# Patient Record
Sex: Female | Born: 1960 | Race: Black or African American | Hispanic: No | Marital: Single | Smoking: Never smoker
Health system: Southern US, Community
[De-identification: ages and names within clinical notes are randomized; demographics above are authoritative.]

## PROBLEM LIST (undated history)

## (undated) DIAGNOSIS — E119 Type 2 diabetes mellitus without complications: Secondary | ICD-10-CM

## (undated) DIAGNOSIS — I10 Essential (primary) hypertension: Secondary | ICD-10-CM

## (undated) HISTORY — PX: ABDOMINAL HYSTERECTOMY: SHX81

---

## 2019-10-14 ENCOUNTER — Other Ambulatory Visit: Payer: Self-pay

## 2019-10-14 ENCOUNTER — Emergency Department (HOSPITAL_COMMUNITY)
Admission: EM | Admit: 2019-10-14 | Discharge: 2019-10-14 | Discharge status: Against Medical Advice | Payer: Medicare Other | Attending: Emergency Medicine | Admitting: Emergency Medicine

## 2019-10-14 ENCOUNTER — Encounter (HOSPITAL_COMMUNITY): Payer: Self-pay | Admitting: Pediatrics

## 2019-10-14 DIAGNOSIS — Z5321 Procedure and treatment not carried out due to patient leaving prior to being seen by health care provider: Secondary | ICD-10-CM | POA: Insufficient documentation

## 2019-10-14 DIAGNOSIS — H538 Other visual disturbances: Secondary | ICD-10-CM | POA: Insufficient documentation

## 2019-10-14 DIAGNOSIS — R0789 Other chest pain: Secondary | ICD-10-CM | POA: Insufficient documentation

## 2019-10-14 HISTORY — DX: Type 2 diabetes mellitus without complications: E11.9

## 2019-10-14 HISTORY — DX: Essential (primary) hypertension: I10

## 2019-10-14 NOTE — ED Triage Notes (Signed)
C/o chest pain more consistent the last 2 days; c/o blurred visions as well. Stated hx of high blood pressure and prediabetic

## 2019-10-14 NOTE — ED Notes (Signed)
Pt approached this tech stating she was leaving. Pt was encouraged to stay, pt left.

## 2019-11-20 ENCOUNTER — Encounter (HOSPITAL_COMMUNITY): Payer: Self-pay | Admitting: Emergency Medicine

## 2019-11-20 ENCOUNTER — Other Ambulatory Visit: Payer: Self-pay

## 2019-11-20 ENCOUNTER — Emergency Department (HOSPITAL_COMMUNITY)
Admission: EM | Admit: 2019-11-20 | Discharge: 2019-11-20 | Disposition: A | Discharge status: Home / Self Care | Payer: Medicare Other | Attending: Emergency Medicine | Admitting: Emergency Medicine

## 2019-11-20 DIAGNOSIS — M5431 Sciatica, right side: Secondary | ICD-10-CM

## 2019-11-20 MED ORDER — METHOCARBAMOL 500 MG PO TABS: 500.0000 mg | ORAL_TABLET | Freq: Two times a day (BID) | ORAL | 0 refills | Status: DC

## 2019-11-20 MED ORDER — DICLOFENAC SODIUM 1 % EX GEL: 2.0000 g | Freq: Four times a day (QID) | CUTANEOUS | 0 refills | Status: AC

## 2019-11-20 MED ORDER — MELOXICAM 7.5 MG PO TABS: 7.5000 mg | ORAL_TABLET | Freq: Every day | ORAL | 0 refills | Status: DC

## 2019-11-20 NOTE — ED Provider Notes (Signed)
[facility] EMERGENCY DEPARTMENT Provider Note   CSN: [id number] Arrival date & time: 11/20/19  1904     History Chief Complaint  Patient presents with  . Sciatica    Connie Mcintosh is a 59 y.o. female.  HPI 59 year old female with a history sciatica, hypertension, DM type II presents to the ER for right-sided sciatica symptoms.  Patient says she has a history, thinks that she has had a flareup secondary to walking around at work.  She notes pain that starts in her buttocks and radiates down her leg.  She also endorses chronic neck pain and occasional numbness and tingling down both her arms.  She has tried to take Motrin for her symptoms with little relief.  She has still been able to ambulate, though does note some pain when she walks.  She denies any groin numbness, foot drop, incontinence of bowel and bladder, night sweats, unintended weight loss, fever, chills.  no history of IV drug use, cancer.  She does not follow with anyone for her back pain.  He states that she knows that she has a history of a herniated disc in her low back.    Past Medical History:  Diagnosis Date  . Diabetes mellitus without complication (HCC)    Prediabetic  . Hypertension     There are no problems to display for this patient.   Past Surgical History:  Procedure Laterality Date  . ABDOMINAL HYSTERECTOMY       OB History   No obstetric history on file.     No family history on file.  Social History   Tobacco Use  . Smoking status: Never Smoker  . Smokeless tobacco: Never Used  Substance Use Topics  . Alcohol use: Never  . Drug use: Never    Home Medications Prior to Admission medications   Medication Sig Start Date End Date Taking? Authorizing Provider  diclofenac Sodium (VOLTAREN) 1 % GEL Apply 2 g topically 4 (four) times daily. 11/20/19   Mare Ferrari, PA-C  meloxicam (MOBIC) 7.5 MG tablet Take 1 tablet (7.5 mg total) by mouth daily. 11/20/19   Mare Ferrari,  PA-C  methocarbamol (ROBAXIN) 500 MG tablet Take 1 tablet (500 mg total) by mouth 2 (two) times daily. 11/20/19   Mare Ferrari, PA-C    Allergies    Morphine and related  Review of Systems   Review of Systems  Constitutional: Negative for chills and fever.  Musculoskeletal: Positive for back pain and gait problem.  Skin: Negative for rash and wound.  Neurological: Positive for numbness. Negative for weakness.    Physical Exam Updated Vital Signs BP (!) 152/103 (BP Location: Right Arm)   Pulse 78   Temp 98.5 F (36.9 C) (Oral)   Resp 20   Ht 5\' 2"  (1.575 m)   Wt 108 kg   SpO2 98%   BMI 43.55 kg/m   Physical Exam Vitals and nursing note reviewed.  Constitutional:      General: She is not in acute distress.    Appearance: She is well-developed.  HENT:     Head: Normocephalic and atraumatic.     Mouth/Throat:     Mouth: Mucous membranes are moist.  Eyes:     Conjunctiva/sclera: Conjunctivae normal.     Pupils: Pupils are equal, round, and reactive to light.  Cardiovascular:     Rate and Rhythm: Normal rate and regular rhythm.     Pulses: Normal pulses.     Heart  sounds: Normal heart sounds. No murmur.     Comments: 2+ DP pulses bilaterally Pulmonary:     Effort: Pulmonary effort is normal. No respiratory distress.     Breath sounds: Normal breath sounds.  Abdominal:     Palpations: Abdomen is soft.     Tenderness: There is no abdominal tenderness.  Musculoskeletal:        General: Tenderness present. No swelling, deformity or signs of injury.     Cervical back: Neck supple.     Right lower leg: No edema.     Left lower leg: No edema.     Comments: 5/5 strength in lower extremities bilaterally.  Sensations intact.  Full range of motion of hip, knee, ankle.  Patient was able to bend down and touch her toes and ambulate in the ER in front of me.  No noticeable neuro deficits in upper extremities, 5/5 strength in upper extremities and full range of motion.   Tenderness over right SI joint and ASIS.  Mild midline tenderness to L-spine.  No noticeable step-offs, crepitus, erythema, rashes.   Skin:    General: Skin is warm and dry.     Findings: No erythema or rash.  Neurological:     General: No focal deficit present.     Mental Status: She is alert and oriented to person, place, and time.     Sensory: No sensory deficit.     Motor: No weakness.     Gait: Gait normal.  Psychiatric:        Mood and Affect: Mood normal.        Behavior: Behavior normal.     ED Results / Procedures / Treatments   Labs (all labs ordered are listed, but only abnormal results are displayed) Labs Reviewed - No data to display  EKG None  Radiology No results found.  Procedures Procedures (including critical care time)  Medications Ordered in ED Medications - No data to display  ED Course  I have reviewed the triage vital signs and the nursing notes.  Pertinent labs & imaging results that were available during my care of the patient were reviewed by me and considered in my medical decision making (see chart for details).    MDM Rules/Calculators/A&P                     Patient with back pain. Mild tenderness to right SI joint and ASIS.  She does have some L-spine midline tenderness, but she states that this is chronic.  She has been told that she has a bulging disc.  Normal neurologic exam without neuro deficits.  Full strength in upper and lower extremities.  Patient denies any red flag signs.  Patient can walk but states is painful.  No loss of bowel or bladder control. No fever, night sweats, weight loss, h/o cancer, IVDU.  I discussed option for x-raying back given midline tenderness, patient states that she would rather follow-up with neurosurgery and have a proper MRI done in outpatient setting.  I have low concern for cauda equina at this time, so I think that this is reasonable.  Clinical picture consistent with sciatica.  Patient has been educated  on return precautions, which include groin numbness, incontinence of bowel and bladder, foot drop, worsening pain.  Discussed treatment options, patient has stated that she has tried naproxen and ibuprofen with little relief.  She has also tried lidocaine patches with little relief.  States that she does not like injections  and denies shot of Toradol today.  I will prescribe her meloxicam, Robaxin and Voltaren gel.  We will defer steroids given that the patient has a history of diabetes.  She would likely benefit from gabapentin, but I will defer this to outpatient management given high side effect profile.  Referral to neurosurgery provided, stressed follow-up.  Patient voices understanding and is agreeable to this.  At this stage in the ED course, the patient has been adequately screened and is stable for discharge.  Final Clinical Impression(s) / ED Diagnoses Final diagnoses:  Right sided sciatica    Rx / DC Orders ED Discharge Orders         Ordered    methocarbamol (ROBAXIN) 500 MG tablet  2 times daily     11/20/19 2037    diclofenac Sodium (VOLTAREN) 1 % GEL  4 times daily     11/20/19 2037    meloxicam (MOBIC) 7.5 MG tablet  Daily     11/20/19 2037           Lyndel Safe 11/20/19 2119    Elnora Morrison, MD 11/21/19 [phone]

## 2019-11-20 NOTE — ED Triage Notes (Signed)
Patient reports sciatica pain onset this week , low back pain radiating to right leg , denies injury/ambulatory .

## 2019-11-20 NOTE — Discharge Instructions (Addendum)
Please take meloxicam for pain, use the Voltaren gel for localized area of pain.  Please take muscle relaxer at night as it does have sedating effects.  Do not drink and drive on this medication.  Please return to the ER if you have worsening pain, groin numbness, inability to hold your urine or stool, development of foot drop, sudden increase in numbness.  Please call the phone number for [facility] to establish an appointment.  I have provided some exercises for your low back to help your sciatica symptoms.

## 2020-01-09 ENCOUNTER — Encounter (HOSPITAL_COMMUNITY): Payer: Self-pay

## 2020-01-09 ENCOUNTER — Emergency Department (HOSPITAL_COMMUNITY)
Admission: EM | Admit: 2020-01-09 | Discharge: 2020-01-09 | Disposition: A | Discharge status: Home / Self Care | Payer: Medicare Other | Attending: Emergency Medicine | Admitting: Emergency Medicine

## 2020-01-09 ENCOUNTER — Emergency Department (HOSPITAL_COMMUNITY): Payer: Medicare Other

## 2020-01-09 ENCOUNTER — Other Ambulatory Visit: Payer: Self-pay

## 2020-01-09 DIAGNOSIS — Z5321 Procedure and treatment not carried out due to patient leaving prior to being seen by health care provider: Secondary | ICD-10-CM | POA: Diagnosis not present

## 2020-01-09 DIAGNOSIS — R0789 Other chest pain: Secondary | ICD-10-CM | POA: Diagnosis present

## 2020-01-09 LAB — CBC
HCT: 39.3 % (ref 36.0–46.0)
Hemoglobin: 12.1 g/dL (ref 12.0–15.0)
MCH: 21.5 pg — ABNORMAL LOW (ref 26.0–34.0)
MCHC: 30.8 g/dL (ref 30.0–36.0)
MCV: 69.7 fL — ABNORMAL LOW (ref 80.0–100.0)
Platelets: 240 10*3/uL (ref 150–400)
RBC: 5.64 MIL/uL — ABNORMAL HIGH (ref 3.87–5.11)
RDW: 16 % — ABNORMAL HIGH (ref 11.5–15.5)
WBC: 8.6 10*3/uL (ref 4.0–10.5)
nRBC: 0 % (ref 0.0–0.2)

## 2020-01-09 LAB — BASIC METABOLIC PANEL
Anion gap: 10 (ref 5–15)
BUN: 16 mg/dL (ref 6–20)
CO2: 24 mmol/L (ref 22–32)
Calcium: 9.3 mg/dL (ref 8.9–10.3)
Chloride: 106 mmol/L (ref 98–111)
Creatinine, Ser: 1.11 mg/dL — ABNORMAL HIGH (ref 0.44–1.00)
GFR calc Af Amer: >60 mL/min (ref >60)
GFR calc non Af Amer: 55 mL/min — ABNORMAL LOW (ref >60)
Glucose, Bld: 100 mg/dL — ABNORMAL HIGH (ref 70–99)
Potassium: 3.5 mmol/L (ref 3.5–5.1)
Sodium: 140 mmol/L (ref 135–145)

## 2020-01-09 LAB — TROPONIN I (HIGH SENSITIVITY): Troponin I (High Sensitivity): 3 ng/L (ref <18)

## 2020-01-09 MED ORDER — SODIUM CHLORIDE 0.9% FLUSH: 3.0000 mL | Freq: Once | INTRAVENOUS | Status: DC

## 2020-01-09 NOTE — ED Notes (Signed)
Pt Left did not want to continue waiting.

## 2020-01-09 NOTE — ED Triage Notes (Signed)
Pt reports Left chest pain radiating to her back associated with "feeling out of it" and diaphoretic x1 week.

## 2020-07-18 ENCOUNTER — Ambulatory Visit (HOSPITAL_COMMUNITY)
Admission: EM | Admit: 2020-07-18 | Discharge: 2020-07-18 | Disposition: A | Discharge status: Home / Self Care | Payer: Medicare Other | Attending: Family Medicine | Admitting: Family Medicine

## 2020-07-18 ENCOUNTER — Emergency Department (HOSPITAL_COMMUNITY): Payer: Medicare Other

## 2020-07-18 ENCOUNTER — Emergency Department (HOSPITAL_COMMUNITY)
Admission: EM | Admit: 2020-07-18 | Discharge: 2020-07-18 | Disposition: A | Discharge status: Home / Self Care | Payer: Medicare Other | Attending: Emergency Medicine | Admitting: Emergency Medicine

## 2020-07-18 ENCOUNTER — Encounter (HOSPITAL_COMMUNITY): Payer: Self-pay

## 2020-07-18 DIAGNOSIS — R197 Diarrhea, unspecified: Secondary | ICD-10-CM | POA: Diagnosis not present

## 2020-07-18 DIAGNOSIS — Z20822 Contact with and (suspected) exposure to covid-19: Secondary | ICD-10-CM | POA: Insufficient documentation

## 2020-07-18 DIAGNOSIS — E119 Type 2 diabetes mellitus without complications: Secondary | ICD-10-CM | POA: Diagnosis not present

## 2020-07-18 DIAGNOSIS — B349 Viral infection, unspecified: Secondary | ICD-10-CM | POA: Diagnosis not present

## 2020-07-18 DIAGNOSIS — R1084 Generalized abdominal pain: Secondary | ICD-10-CM | POA: Insufficient documentation

## 2020-07-18 DIAGNOSIS — R55 Syncope and collapse: Secondary | ICD-10-CM

## 2020-07-18 DIAGNOSIS — R112 Nausea with vomiting, unspecified: Secondary | ICD-10-CM | POA: Diagnosis not present

## 2020-07-18 DIAGNOSIS — R42 Dizziness and giddiness: Secondary | ICD-10-CM | POA: Diagnosis present

## 2020-07-18 DIAGNOSIS — R1110 Vomiting, unspecified: Secondary | ICD-10-CM

## 2020-07-18 DIAGNOSIS — I10 Essential (primary) hypertension: Secondary | ICD-10-CM | POA: Insufficient documentation

## 2020-07-18 LAB — COMPREHENSIVE METABOLIC PANEL
ALT: 11 U/L (ref 0–44)
AST: 15 U/L (ref 15–41)
Albumin: 3.6 g/dL (ref 3.5–5.0)
Alkaline Phosphatase: 79 U/L (ref 38–126)
Anion gap: 11 (ref 5–15)
BUN: 17 mg/dL (ref 6–20)
CO2: 24 mmol/L (ref 22–32)
Calcium: 9.5 mg/dL (ref 8.9–10.3)
Chloride: 108 mmol/L (ref 98–111)
Creatinine, Ser: 1.2 mg/dL — ABNORMAL HIGH (ref 0.44–1.00)
GFR, Estimated: 52 mL/min — ABNORMAL LOW (ref >60)
Glucose, Bld: 90 mg/dL (ref 70–99)
Potassium: 3.8 mmol/L (ref 3.5–5.1)
Sodium: 143 mmol/L (ref 135–145)
Total Bilirubin: 0.7 mg/dL (ref 0.3–1.2)
Total Protein: 7.1 g/dL (ref 6.5–8.1)

## 2020-07-18 LAB — CBC
HCT: 39.7 % (ref 36.0–46.0)
Hemoglobin: 12 g/dL (ref 12.0–15.0)
MCH: 21.2 pg — ABNORMAL LOW (ref 26.0–34.0)
MCHC: 30.2 g/dL (ref 30.0–36.0)
MCV: 70 fL — ABNORMAL LOW (ref 80.0–100.0)
Platelets: 234 10*3/uL (ref 150–400)
RBC: 5.67 MIL/uL — ABNORMAL HIGH (ref 3.87–5.11)
RDW: 16.1 % — ABNORMAL HIGH (ref 11.5–15.5)
WBC: 7.2 10*3/uL (ref 4.0–10.5)
nRBC: 0 % (ref 0.0–0.2)

## 2020-07-18 LAB — URINALYSIS, ROUTINE W REFLEX MICROSCOPIC
Bilirubin Urine: NEGATIVE
Glucose, UA: NEGATIVE mg/dL
Hgb urine dipstick: NEGATIVE
Ketones, ur: NEGATIVE mg/dL
Leukocytes,Ua: NEGATIVE
Nitrite: NEGATIVE
Protein, ur: NEGATIVE mg/dL
Specific Gravity, Urine: 1.032 — ABNORMAL HIGH (ref 1.005–1.030)
pH: 5 (ref 5.0–8.0)

## 2020-07-18 LAB — CBG MONITORING, ED: Glucose-Capillary: 77 mg/dL (ref 70–99)

## 2020-07-18 LAB — LIPASE, BLOOD: Lipase: 37 U/L (ref 11–51)

## 2020-07-18 LAB — POC SARS CORONAVIRUS 2 AG -  ED: SARS Coronavirus 2 Ag: NEGATIVE

## 2020-07-18 MED ORDER — SODIUM CHLORIDE 0.9 % IV BOLUS
1000.0000 mL | Freq: Once | INTRAVENOUS | Status: AC
  Administered 2020-07-18: 1000 mL via INTRAVENOUS

## 2020-07-18 MED ORDER — FENTANYL CITRATE (PF) 100 MCG/2ML IJ SOLN
50.0000 ug | Freq: Once | INTRAMUSCULAR | Status: AC
Start: 2020-07-18 — End: 2020-07-18
  Administered 2020-07-18: 50 ug via INTRAVENOUS
  Filled 2020-07-18: qty 2

## 2020-07-18 MED ORDER — IOHEXOL 300 MG/ML  SOLN
100.0000 mL | Freq: Once | INTRAMUSCULAR | Status: AC | PRN
  Administered 2020-07-18: 100 mL via INTRAVENOUS

## 2020-07-18 MED ORDER — ONDANSETRON HCL 4 MG/2ML IJ SOLN
4.0000 mg | Freq: Once | INTRAMUSCULAR | Status: AC
  Administered 2020-07-18: 4 mg via INTRAVENOUS
  Filled 2020-07-18: qty 2

## 2020-07-18 NOTE — ED Notes (Signed)
Patient transported to CT 

## 2020-07-18 NOTE — ED Notes (Signed)
Patient verbalized understanding of discharge instructions. Opportunity for questions and answers.  

## 2020-07-18 NOTE — ED Triage Notes (Signed)
Pt states nausea/diarrhea onset yesterday. Began vomiting this morning and states she had episode of syncope at home while in the bathroom and unconscious "for several minutes". States she was alone at the time. Drove herself here.  Unable to take BP meds this morning 2/2 n/v. Does not check glucose at home. Notified N. Burky of pt status and c/c; orders for cbg and EKG received. Pt denies CP, diaphoresis or new back pain.  CBG 77

## 2020-07-18 NOTE — ED Provider Notes (Signed)
[place] EMERGENCY DEPARTMENT Provider Note   CSN: [id number] Arrival date & time: 07/18/20  1332     History Chief Complaint  Patient presents with  . Loss of Consciousness  . Diarrhea  . Nausea    Connie Mcintosh is a 60 y.o. female.  Presents to ER with 2 complaints.  She states that she has not been feeling well for the past day or so.  Has had nausea, occasional episodes of diarrhea as well as 1 episode of vomiting this morning.  Nonbloody nonbilious.  Also having some generalized abdominal discomfort.  Pain is currently mild, nausea currently moderate.  No blood in stool or blood in vomit.  No vaginal bleeding, vaginal discharge, not sexually active.   Patient states that after she was going to the bathroom she felt lightheaded and thinks she passed out.  Denies head trauma.    HPI     Past Medical History:  Diagnosis Date  . Diabetes mellitus without complication ([place])    Prediabetic  . Hypertension     There are no problems to display for this patient.   Past Surgical History:  Procedure Laterality Date  . ABDOMINAL HYSTERECTOMY       OB History   No obstetric history on file.     No family history on file.  Social History   Tobacco Use  . Smoking status: Never Smoker  . Smokeless tobacco: Never Used  Substance Use Topics  . Alcohol use: Never  . Drug use: Never    Home Medications Prior to Admission medications   Medication Sig Start Date End Date Taking? Authorizing Provider  diclofenac Sodium (VOLTAREN) 1 % GEL Apply 2 g topically 4 (four) times daily. 11/20/19   Garald Balding, PA-C  meloxicam (MOBIC) 7.5 MG tablet Take 1 tablet (7.5 mg total) by mouth daily. 11/20/19   Garald Balding, PA-C  methocarbamol (ROBAXIN) 500 MG tablet Take 1 tablet (500 mg total) by mouth 2 (two) times daily. 11/20/19   Garald Balding, PA-C    Allergies    Morphine and related  Review of Systems   Review of Systems  Constitutional: Negative  for chills and fever.  HENT: Negative for ear pain and sore throat.   Eyes: Negative for pain and visual disturbance.  Respiratory: Negative for cough and shortness of breath.   Cardiovascular: Negative for chest pain and palpitations.  Gastrointestinal: Positive for abdominal pain, diarrhea, nausea and vomiting.  Genitourinary: Negative for dysuria and hematuria.  Musculoskeletal: Negative for arthralgias and back pain.  Skin: Negative for color change and rash.  Neurological: Positive for syncope. Negative for seizures.  All other systems reviewed and are negative.   Physical Exam Updated Vital Signs BP (!) 148/87   Pulse (!) 58   Temp 97.8 F (36.6 C) (Oral)   Resp 16   SpO2 100%   Physical Exam Vitals and nursing note reviewed.  Constitutional:      General: She is not in acute distress.    Appearance: She is well-developed and well-nourished.  HENT:     Head: Normocephalic and atraumatic.  Eyes:     Conjunctiva/sclera: Conjunctivae normal.  Cardiovascular:     Rate and Rhythm: Normal rate and regular rhythm.     Heart sounds: No murmur heard.   Pulmonary:     Effort: Pulmonary effort is normal. No respiratory distress.     Breath sounds: Normal breath sounds.  Abdominal:     Palpations: Abdomen  is soft.     Tenderness: There is no abdominal tenderness.     Comments: TTP in RLQ and LLQ  Musculoskeletal:        General: No edema.     Cervical back: Neck supple.  Skin:    General: Skin is warm and dry.  Neurological:     General: No focal deficit present.     Mental Status: She is alert.  Psychiatric:        Mood and Affect: Mood and affect normal.     ED Results / Procedures / Treatments   Labs (all labs ordered are listed, but only abnormal results are displayed) Labs Reviewed  COMPREHENSIVE METABOLIC PANEL - Abnormal; Notable for the following components:      Result Value   Creatinine, Ser 1.20 (*)    GFR, Estimated 52 (*)    All other components  within normal limits  CBC - Abnormal; Notable for the following components:   RBC 5.67 (*)    MCV 70.0 (*)    MCH 21.2 (*)    RDW 16.1 (*)    All other components within normal limits  URINALYSIS, ROUTINE W REFLEX MICROSCOPIC - Abnormal; Notable for the following components:   Color, Urine STRAW (*)    Specific Gravity, Urine 1.032 (*)    All other components within normal limits  SARS CORONAVIRUS 2 (TAT 6-24 HRS)  LIPASE, BLOOD  POC SARS CORONAVIRUS 2 AG -  ED    EKG EKG Interpretation  Date/Time:  Wednesday July 18 2020 14:13:19 EST Ventricular Rate:  72 PR Interval:  182 QRS Duration: 80 QT Interval:  386 QTC Calculation: 422 R Axis:   -5 Text Interpretation: Normal sinus rhythm Minimal voltage criteria for LVH, may be normal variant ( R in aVL ) Borderline ECG Confirmed by Madalyn Rob [phone]) on 07/18/2020 5:17:50 PM   Radiology DG Chest 2 View  Result Date: 07/18/2020 CLINICAL DATA:  Syncope. EXAM: CHEST - 2 VIEW COMPARISON:  January 09, 2020 FINDINGS: The heart size and mediastinal contours are within normal limits. Both lungs are clear. The visualized skeletal structures are unremarkable. IMPRESSION: No active cardiopulmonary disease. Electronically Signed   By: Virgina Norfolk M.D.   On: 07/18/2020 18:59   CT ABDOMEN PELVIS W CONTRAST  Result Date: 07/18/2020 CLINICAL DATA:  Right lower quadrant pain EXAM: CT ABDOMEN AND PELVIS WITH CONTRAST TECHNIQUE: Multidetector CT imaging of the abdomen and pelvis was performed using the standard protocol following bolus administration of intravenous contrast. CONTRAST:  164mL OMNIPAQUE IOHEXOL 300 MG/ML  SOLN COMPARISON:  None. FINDINGS: Lower chest: The visualized heart size within normal limits. No pericardial fluid/thickening. No hiatal hernia. The visualized portions of the lungs are clear. Hepatobiliary: The liver is normal in density without focal abnormality.The main portal vein is patent. No evidence of calcified  gallstones, gallbladder wall thickening or biliary dilatation. Pancreas: Unremarkable. No pancreatic ductal dilatation or surrounding inflammatory changes. Spleen: Normal in size without focal abnormality. Adrenals/Urinary Tract: Both adrenal glands appear normal. The kidneys and collecting system appear normal without evidence of urinary tract calculus or hydronephrosis. Bladder is unremarkable. Stomach/Bowel: The stomach, small bowel, are normal in appearance. The cecum appears to be mildly tortuous and fluid-filled. The appendix is unremarkable. Scattered colonic diverticula are noted. No surrounding wall thickening or inflammatory changes. Vascular/Lymphatic: There are no enlarged mesenteric, retroperitoneal, or pelvic lymph nodes. No significant vascular findings are present. Reproductive: The patient is status post hysterectomy. At the vaginal cuff there appears  to be heterogeneous soft tissue density. The adnexa are unremarkable. Other: No evidence of abdominal wall mass or hernia. Musculoskeletal: No acute or significant osseous findings. IMPRESSION: Mildly fluid-filled distended cecum, however normal appearing appendix. Status post hysterectomy with a soft tissue density at the vaginal cuff which is nonspecific. If further evaluation is required would recommend pelvic ultrasound. Electronically Signed   By: Prudencio Pair M.D.   On: 07/18/2020 19:51    Procedures Procedures (including critical care time)  Medications Ordered in ED Medications  fentaNYL (SUBLIMAZE) injection 50 mcg (50 mcg Intravenous Given 07/18/20 1840)  ondansetron (ZOFRAN) injection 4 mg (4 mg Intravenous Given 07/18/20 1838)  sodium chloride 0.9 % bolus 1,000 mL (0 mLs Intravenous Stopped 07/18/20 2048)  iohexol (OMNIPAQUE) 300 MG/ML solution 100 mL (100 mLs Intravenous Contrast Given 07/18/20 1922)    ED Course  I have reviewed the triage vital signs and the nursing notes.  Pertinent labs & imaging results that were  available during my care of the patient were reviewed by me and considered in my medical decision making (see chart for details).    MDM Rules/Calculators/A&P                          60 year old lady presents to ER with myriad complaints including syncopal episode as well as nausea, vomiting, diarrhea and abdominal pain.  Regarding her GI complaints, suspect most likely acute viral process.  Her basic labs were grossly within normal limits and her CT abdomen pelvis was negative for acute abdominal pelvic pathology.  UA negative.  The syncopal episode I suspect most likely was related to dehydration, vasovagal in etiology.  No events on telemetry monitoring and no acute changes on EKG.  On reassessment after receiving antiemetics and fluids, patient states symptoms markedly improved.  Believe she is appropriate for outpatient management.  We will send for COVID-19 testing.  DC home.    After the discussed management above, the patient was determined to be safe for discharge.  The patient was in agreement with this plan and all questions regarding their care were answered.  ED return precautions were discussed and the patient will return to the ED with any significant worsening of condition.    Final Clinical Impression(s) / ED Diagnoses Final diagnoses:  Suspected COVID-19 virus infection  Viral syndrome  Syncope, unspecified syncope type    Rx / DC Orders ED Discharge Orders    None       Lucrezia Starch, MD 07/18/20 2200

## 2020-07-18 NOTE — Discharge Instructions (Signed)
Please check MyChart tomorrow morning for results of your COVID test.  Please follow-up with primary doctor regarding your episode of passing out and your current illness.  Return to ER if you develop worsening chest pain, difficulty in breathing or other new concerning symptom.

## 2020-07-18 NOTE — ED Notes (Signed)
Patient is being discharged from the Urgent Care and sent to the Emergency Department via WC . Per Marilynn Rail, NP, patient is in need of higher level of care due to syncope, n/v/d. Patient is aware and verbalizes understanding of plan of care.  Vitals:   07/18/20 1308  BP: (!) 165/111  Pulse: 80  Resp: 18  Temp: 98.2 F (36.8 C)  SpO2: 100%

## 2020-07-18 NOTE — ED Triage Notes (Signed)
Pt reports n/v/d since this morning that caused her to pass out in the bathroom this morning. Pt is not vaccinated for covid.

## 2020-07-19 LAB — SARS CORONAVIRUS 2 (TAT 6-24 HRS): SARS Coronavirus 2: NEGATIVE

## 2020-08-29 ENCOUNTER — Other Ambulatory Visit: Payer: Self-pay

## 2020-08-29 ENCOUNTER — Encounter (HOSPITAL_COMMUNITY): Payer: Self-pay | Admitting: Emergency Medicine

## 2020-08-29 ENCOUNTER — Emergency Department (HOSPITAL_COMMUNITY)
Admission: EM | Admit: 2020-08-29 | Discharge: 2020-08-29 | Disposition: A | Discharge status: Home / Self Care | Payer: Medicare Other | Attending: Emergency Medicine | Admitting: Emergency Medicine

## 2020-08-29 DIAGNOSIS — M791 Myalgia, unspecified site: Secondary | ICD-10-CM | POA: Insufficient documentation

## 2020-08-29 DIAGNOSIS — J029 Acute pharyngitis, unspecified: Secondary | ICD-10-CM | POA: Insufficient documentation

## 2020-08-29 DIAGNOSIS — R197 Diarrhea, unspecified: Secondary | ICD-10-CM | POA: Insufficient documentation

## 2020-08-29 DIAGNOSIS — Z5321 Procedure and treatment not carried out due to patient leaving prior to being seen by health care provider: Secondary | ICD-10-CM | POA: Insufficient documentation

## 2020-08-29 DIAGNOSIS — R519 Headache, unspecified: Secondary | ICD-10-CM | POA: Insufficient documentation

## 2020-08-29 DIAGNOSIS — Z20822 Contact with and (suspected) exposure to covid-19: Secondary | ICD-10-CM | POA: Insufficient documentation

## 2020-08-29 LAB — COMPREHENSIVE METABOLIC PANEL
ALT: 12 U/L (ref 0–44)
AST: 17 U/L (ref 15–41)
Albumin: 3.5 g/dL (ref 3.5–5.0)
Alkaline Phosphatase: 78 U/L (ref 38–126)
Anion gap: 11 (ref 5–15)
BUN: 11 mg/dL (ref 6–20)
CO2: 23 mmol/L (ref 22–32)
Calcium: 9.1 mg/dL (ref 8.9–10.3)
Chloride: 109 mmol/L (ref 98–111)
Creatinine, Ser: 1.18 mg/dL — ABNORMAL HIGH (ref 0.44–1.00)
GFR, Estimated: 54 mL/min — ABNORMAL LOW (ref >60)
Glucose, Bld: 106 mg/dL — ABNORMAL HIGH (ref 70–99)
Potassium: 3.6 mmol/L (ref 3.5–5.1)
Sodium: 143 mmol/L (ref 135–145)
Total Bilirubin: 0.8 mg/dL (ref 0.3–1.2)
Total Protein: 7 g/dL (ref 6.5–8.1)

## 2020-08-29 LAB — CBC
HCT: 37.6 % (ref 36.0–46.0)
Hemoglobin: 11.6 g/dL — ABNORMAL LOW (ref 12.0–15.0)
MCH: 21.2 pg — ABNORMAL LOW (ref 26.0–34.0)
MCHC: 30.9 g/dL (ref 30.0–36.0)
MCV: 68.9 fL — ABNORMAL LOW (ref 80.0–100.0)
Platelets: 247 10*3/uL (ref 150–400)
RBC: 5.46 MIL/uL — ABNORMAL HIGH (ref 3.87–5.11)
RDW: 16.5 % — ABNORMAL HIGH (ref 11.5–15.5)
WBC: 7 10*3/uL (ref 4.0–10.5)
nRBC: 0 % (ref 0.0–0.2)

## 2020-08-29 LAB — LIPASE, BLOOD: Lipase: 44 U/L (ref 11–51)

## 2020-08-29 LAB — SARS CORONAVIRUS 2 (TAT 6-24 HRS): SARS Coronavirus 2: NEGATIVE

## 2020-08-29 NOTE — ED Triage Notes (Signed)
Pt c/o diarrhea, headache, sore throat and body aches x 1 day. Reports she may have had a covid exposure at work.

## 2020-08-29 NOTE — ED Notes (Signed)
Pt stated she could not wait any longer and left.

## 2020-08-29 NOTE — ED Notes (Signed)
Pt called for vitals with no response. 

## 2020-09-24 ENCOUNTER — Encounter (HOSPITAL_COMMUNITY): Payer: Self-pay | Admitting: Emergency Medicine

## 2020-09-24 ENCOUNTER — Emergency Department (HOSPITAL_COMMUNITY): Payer: Medicare Other

## 2020-09-24 ENCOUNTER — Emergency Department (HOSPITAL_COMMUNITY)
Admission: EM | Admit: 2020-09-24 | Discharge: 2020-09-24 | Disposition: A | Discharge status: Home / Self Care | Payer: Medicare Other | Attending: Emergency Medicine | Admitting: Emergency Medicine

## 2020-09-24 ENCOUNTER — Other Ambulatory Visit: Payer: Self-pay

## 2020-09-24 DIAGNOSIS — R42 Dizziness and giddiness: Secondary | ICD-10-CM | POA: Diagnosis present

## 2020-09-24 DIAGNOSIS — Z79899 Other long term (current) drug therapy: Secondary | ICD-10-CM | POA: Diagnosis not present

## 2020-09-24 DIAGNOSIS — I10 Essential (primary) hypertension: Secondary | ICD-10-CM | POA: Diagnosis not present

## 2020-09-24 DIAGNOSIS — R1012 Left upper quadrant pain: Secondary | ICD-10-CM | POA: Diagnosis not present

## 2020-09-24 DIAGNOSIS — R55 Syncope and collapse: Secondary | ICD-10-CM | POA: Insufficient documentation

## 2020-09-24 DIAGNOSIS — E119 Type 2 diabetes mellitus without complications: Secondary | ICD-10-CM | POA: Insufficient documentation

## 2020-09-24 LAB — URINALYSIS, ROUTINE W REFLEX MICROSCOPIC
Bacteria, UA: NONE SEEN
Bilirubin Urine: NEGATIVE
Glucose, UA: >=500 mg/dL — AB
Hgb urine dipstick: NEGATIVE
Ketones, ur: NEGATIVE mg/dL
Leukocytes,Ua: NEGATIVE
Nitrite: NEGATIVE
Protein, ur: NEGATIVE mg/dL
Specific Gravity, Urine: 1.008 (ref 1.005–1.030)
pH: 5 (ref 5.0–8.0)

## 2020-09-24 LAB — COMPREHENSIVE METABOLIC PANEL
ALT: 14 U/L (ref 0–44)
AST: 18 U/L (ref 15–41)
Albumin: 4.2 g/dL (ref 3.5–5.0)
Alkaline Phosphatase: 94 U/L (ref 38–126)
Anion gap: 8 (ref 5–15)
BUN: 18 mg/dL (ref 6–20)
CO2: 24 mmol/L (ref 22–32)
Calcium: 9 mg/dL (ref 8.9–10.3)
Chloride: 107 mmol/L (ref 98–111)
Creatinine, Ser: 1.16 mg/dL — ABNORMAL HIGH (ref 0.44–1.00)
GFR, Estimated: 55 mL/min — ABNORMAL LOW (ref >60)
Glucose, Bld: 113 mg/dL — ABNORMAL HIGH (ref 70–99)
Potassium: 3.8 mmol/L (ref 3.5–5.1)
Sodium: 139 mmol/L (ref 135–145)
Total Bilirubin: 0.7 mg/dL (ref 0.3–1.2)
Total Protein: 8.2 g/dL — ABNORMAL HIGH (ref 6.5–8.1)

## 2020-09-24 LAB — CBC WITH DIFFERENTIAL/PLATELET
Abs Immature Granulocytes: 0.04 10*3/uL (ref 0.00–0.07)
Basophils Absolute: 0.1 10*3/uL (ref 0.0–0.1)
Basophils Relative: 1 %
Eosinophils Absolute: 0.2 10*3/uL (ref 0.0–0.5)
Eosinophils Relative: 2 %
HCT: 42 % (ref 36.0–46.0)
Hemoglobin: 12.8 g/dL (ref 12.0–15.0)
Immature Granulocytes: 0 %
Lymphocytes Relative: 26 %
Lymphs Abs: 2.3 10*3/uL (ref 0.7–4.0)
MCH: 21.2 pg — ABNORMAL LOW (ref 26.0–34.0)
MCHC: 30.5 g/dL (ref 30.0–36.0)
MCV: 69.4 fL — ABNORMAL LOW (ref 80.0–100.0)
Monocytes Absolute: 0.6 10*3/uL (ref 0.1–1.0)
Monocytes Relative: 6 %
Neutro Abs: 5.9 10*3/uL (ref 1.7–7.7)
Neutrophils Relative %: 65 %
Platelets: 250 10*3/uL (ref 150–400)
RBC: 6.05 MIL/uL — ABNORMAL HIGH (ref 3.87–5.11)
RDW: 17.9 % — ABNORMAL HIGH (ref 11.5–15.5)
WBC: 9 10*3/uL (ref 4.0–10.5)
nRBC: 0 % (ref 0.0–0.2)

## 2020-09-24 LAB — I-STAT BETA HCG BLOOD, ED (MC, WL, AP ONLY): I-stat hCG, quantitative: <5 m[IU]/mL (ref <5)

## 2020-09-24 LAB — CBG MONITORING, ED: Glucose-Capillary: 96 mg/dL (ref 70–99)

## 2020-09-24 MED ORDER — SODIUM CHLORIDE 0.9 % IV SOLN: INTRAVENOUS | Status: DC

## 2020-09-24 MED ORDER — IOHEXOL 300 MG/ML  SOLN
100.0000 mL | Freq: Once | INTRAMUSCULAR | Status: AC | PRN
  Administered 2020-09-24: 100 mL via INTRAVENOUS

## 2020-09-24 NOTE — ED Triage Notes (Signed)
Pt bib EMS with c/o syncopal episode. Per ems, [place] was [place] [employer] and had a very sudden abdominal pain and began to feel nauseous, weak, dizzy and began sweating. Pt states she was very clammy. Per EMS, witnesses state the pt did have a brief LOC, but they were able to ease her to the ground. Pt does have a hx of DM so witnesses tried to give her a "gummy bear." When EMS arrived pt was conscious and alert and able to follow all commands.   Vitals: 140/100 HR-80 99% RA CBG 160.

## 2020-09-24 NOTE — Discharge Instructions (Addendum)
As discussed, your evaluation today has been largely reassuring.  But, it is important that you monitor your condition carefully, and do not hesitate to return to the ED if you develop new, or concerning changes in your condition. ? ?Otherwise, please follow-up with your physician for appropriate ongoing care. ? ?

## 2020-09-24 NOTE — ED Provider Notes (Signed)
[facility] Note   CSN: [id number] Arrival date & time: 09/24/20  1729     History Chief Complaint  Patient presents with  . Loss of Consciousness    Connie Mcintosh is a 60 y.o. female.  HPI Patient presents after an episode of syncope.  She recalls having a normal day, when she felt some mild unsettled stomach sensation.  She felt lightheaded, began as she lost consciousness, she recalls being lowered to the ground. Currently she describes some abdominal discomfort, left upper quadrant, but otherwise no chest pain, no head pain, no weakness anywhere. She has been in her usual state of health She does have a history of syncope, though not in several years. No recent change in medication, diet, activity.    Past Medical History:  Diagnosis Date  . Diabetes mellitus without complication (HCC)    Prediabetic  . Hypertension     There are no problems to display for this patient.   Past Surgical History:  Procedure Laterality Date  . ABDOMINAL HYSTERECTOMY       OB History   No obstetric history on file.     No family history on file.  Social History   Tobacco Use  . Smoking status: Never Smoker  . Smokeless tobacco: Never Used  Substance Use Topics  . Alcohol use: Never  . Drug use: Never    Home Medications Prior to Admission medications   Medication Sig Start Date End Date Taking? Authorizing Provider  amLODipine (NORVASC) 10 MG tablet Take 10 mg by mouth daily.   Yes [provider]  hydrochlorothiazide (HYDRODIURIL) 12.5 MG tablet Take 12.5 mg by mouth daily.   Yes [provider]  JARDIANCE 10 MG TABS tablet Take 10 mg by mouth daily.   Yes [provider]  diclofenac Sodium (VOLTAREN) 1 % GEL Apply 2 g topically 4 (four) times daily. Patient not taking: No sig reported 11/20/19   Mare Ferrari, PA-C  meloxicam (MOBIC) 7.5 MG tablet Take 1 tablet (7.5 mg total) by mouth  daily. Patient not taking: No sig reported 11/20/19   Mare Ferrari, PA-C  methocarbamol (ROBAXIN) 500 MG tablet Take 1 tablet (500 mg total) by mouth 2 (two) times daily. Patient not taking: No sig reported 11/20/19   Mare Ferrari, PA-C    Allergies    Morphine and Morphine and related  Review of Systems   Review of Systems  Constitutional:       Per HPI, otherwise negative  HENT:       Per HPI, otherwise negative  Respiratory:       Per HPI, otherwise negative  Cardiovascular:       Per HPI, otherwise negative  Gastrointestinal: Positive for abdominal pain and nausea. Negative for vomiting.  Endocrine:       Negative aside from HPI  Genitourinary:       Neg aside from HPI   Musculoskeletal:       Per HPI, otherwise negative  Skin: Negative.   Neurological: Negative for syncope.    Physical Exam Updated Vital Signs BP 132/89   Pulse 71   Temp (!) 97.5 F (36.4 C) (Oral)   Resp 18   Ht 5\' 2"  (1.575 m)   Wt 90.3 kg   SpO2 97%   BMI 36.40 kg/m   Physical Exam Vitals and nursing note reviewed.  Constitutional:      General: She is not in acute distress.  Appearance: She is well-developed.  HENT:     Head: Normocephalic and atraumatic.  Eyes:     Conjunctiva/sclera: Conjunctivae normal.  Cardiovascular:     Rate and Rhythm: Normal rate and regular rhythm.  Pulmonary:     Effort: Pulmonary effort is normal. No respiratory distress.     Breath sounds: Normal breath sounds. No stridor.  Abdominal:     General: There is no distension.     Tenderness: There is abdominal tenderness in the left upper quadrant.  Skin:    General: Skin is warm and dry.  Neurological:     Mental Status: She is alert and oriented to person, place, and time.     Cranial Nerves: No cranial nerve deficit.      ED Results / Procedures / Treatments   Labs (all labs ordered are listed, but only abnormal results are displayed) Labs Reviewed  COMPREHENSIVE METABOLIC PANEL -  Abnormal; Notable for the following components:      Result Value   Glucose, Bld 113 (*)    Creatinine, Ser 1.16 (*)    Total Protein 8.2 (*)    GFR, Estimated 55 (*)    All other components within normal limits  CBC WITH DIFFERENTIAL/PLATELET - Abnormal; Notable for the following components:   RBC 6.05 (*)    MCV 69.4 (*)    MCH 21.2 (*)    RDW 17.9 (*)    All other components within normal limits  URINALYSIS, ROUTINE W REFLEX MICROSCOPIC - Abnormal; Notable for the following components:   Color, Urine STRAW (*)    Glucose, UA >=500 (*)    All other components within normal limits  CBG MONITORING, ED  I-STAT BETA HCG BLOOD, ED ([facility], [facility], [facility] ONLY)    EMS rhythm strip sinus rhythm, rate 81, unremarkable  EKG Sinus rhythm, rate 84, unremarkable  Radiology CT Abdomen Pelvis W Contrast  Result Date: 09/24/2020 CLINICAL DATA:  Left upper quadrant abdominal pain. EXAM: CT ABDOMEN AND PELVIS WITH CONTRAST TECHNIQUE: Multidetector CT imaging of the abdomen and pelvis was performed using the standard protocol following bolus administration of intravenous contrast. CONTRAST:  OMNIPAQUE IOHEXOL 300 MG/ML  SOLN COMPARISON:  July 18, 2020. FINDINGS: Lower chest: No acute abnormality. Hepatobiliary: No focal liver abnormality is seen. No gallstones, gallbladder wall thickening, or biliary dilatation. Pancreas: Unremarkable. No pancreatic ductal dilatation or surrounding inflammatory changes. Spleen: Normal in size without focal abnormality. Adrenals/Urinary Tract: Adrenal glands are unremarkable. Kidneys are normal, without renal calculi, focal lesion, or hydronephrosis. Bladder is unremarkable. Stomach/Bowel: Stomach is within normal limits. Appendix appears normal. No evidence of bowel wall thickening, distention, or inflammatory changes. Vascular/Lymphatic: No significant vascular findings are present. No enlarged abdominal or pelvic lymph nodes. Reproductive: Uterus and bilateral adnexa are  unremarkable. Other: No abdominal wall hernia or abnormality. No abdominopelvic ascites. Musculoskeletal: No acute or significant osseous findings. IMPRESSION: No abnormality seen in the abdomen or pelvis. Electronically Signed   By: Lupita Raider M.D.   On: 09/24/2020 20:54   DG Chest Port 1 View  Result Date: 09/24/2020 CLINICAL DATA:  Syncopal episode EXAM: PORTABLE CHEST 1 VIEW COMPARISON:  Radiograph 07/18/2020 FINDINGS: No consolidation, features of edema, pneumothorax, or effusion. Pulmonary vascularity is normally distributed. The cardiomediastinal contours are unremarkable. No acute osseous or soft tissue abnormality. IMPRESSION: No acute cardiopulmonary abnormality. Electronically Signed   By: Kreg Shropshire M.D.   On: 09/24/2020 18:47    Procedures Procedures   Medications Ordered in ED Medications  0.9 %  sodium chloride infusion ( Intravenous Bolus 09/24/20 1811)  iohexol (OMNIPAQUE) 300 MG/ML solution 100 mL (100 mLs Intravenous Contrast Given 09/24/20 2025)    ED Course  I have reviewed the triage vital signs and the nursing notes.  Pertinent labs & imaging results that were available during my care of the patient were reviewed by me and considered in my medical decision making (see chart for details).   I reviewed again the patient is in no distress.  10:34 PM Patient in no distress.  Hemodynamically she is unremarkable.  With GI again discussed today's findings, including CT abdomen pelvis unremarkable, x-ray chest unremarkable, though she does have some abdominal pain, no evidence for diverticulitis or other acute abdominal phenomena. She had no chest pain throughout the day, including pre or post syncope. Low suspicion for nonsustained tachyarrhythmia or other arrhythmia.  Low suspicion for dissection. However, given the unclear circumstances, patient will follow up with her cardiology colleagues.  MDM Rules/Calculators/A&P MDM Number of Diagnoses or Management  Options Syncope and collapse: new, needed workup   Amount and/or Complexity of Data Reviewed Clinical lab tests: reviewed and ordered Tests in the radiology section of CPT: reviewed and ordered Tests in the medicine section of CPT: reviewed and ordered Decide to obtain previous medical records or to obtain history from someone other than the patient: yes Review and summarize past medical records: yes Independent visualization of images, tracings, or specimens: yes  Risk of Complications, Morbidity, and/or Mortality Presenting problems: high Diagnostic procedures: high Management options: high  Critical Care Total time providing critical care: < 30 minutes  Patient Progress Patient progress: stable  Final Clinical Impression(s) / ED Diagnoses Final diagnoses:  Syncope and collapse      Gerhard Munch, MD 09/24/20 2235

## 2020-09-24 NOTE — ED Notes (Signed)
Pt begin saying her legs were numb, after removing her shoes she said it no longer felt numb.

## 2020-09-28 ENCOUNTER — Other Ambulatory Visit: Payer: Self-pay

## 2020-09-28 ENCOUNTER — Ambulatory Visit (INDEPENDENT_AMBULATORY_CARE_PROVIDER_SITE_OTHER): Payer: Medicare Other | Admitting: Internal Medicine

## 2020-09-28 ENCOUNTER — Encounter: Payer: Self-pay | Admitting: Internal Medicine

## 2020-09-28 VITALS — BP 142/100 | HR 72 | Ht 62.0 in | Wt 202.6 lb

## 2020-09-28 DIAGNOSIS — E119 Type 2 diabetes mellitus without complications: Secondary | ICD-10-CM

## 2020-09-28 DIAGNOSIS — R55 Syncope and collapse: Secondary | ICD-10-CM | POA: Diagnosis not present

## 2020-09-28 DIAGNOSIS — I10 Essential (primary) hypertension: Secondary | ICD-10-CM | POA: Diagnosis not present

## 2020-09-28 NOTE — Progress Notes (Signed)
Cardiology Office Note:    Date:  09/28/2020   ID:  Cosette Prindle, DOB [birth year], MRN [id number]  PCP:  [facility]  Cardiologist:  No primary care provider on file.  Electrophysiologist:  None   Referring MD: [facility], [facility]   Chief Complaint/Reason for Referral: Syncope  History of Present Illness:    Griffin Dewilde is a 60 y.o. female with a history of DM2, HTN.  Presents for repeated episodes of syncope, for in the recent past.  She was driving to [facility] and felt well that day.  She was able to make it back to town and when she went to the post office she had a syncopal episode.  She has no major symptoms throughout the day when these events occur, but just prior to syncope she will have diaphoresis, clammy skin, and chest pressure and feels as if she is having a heart attack.  When she regains consciousness, she notes nausea and abdominal pain.  She has had 2 episodes requiring ED presentation and has been told her blood glucose is normal.  During the first of these recent episodes, blood pressure was 170s systolic.  She does have a sense that she is going to faint and is able to lean on something but is not able to avoid fainting.  Many episodes of fainting in her 49s - while showering or while walking, gets a weak feeling, chest pain, and drop. Had a heart monitor - doesn't remember results but got nitro. Told she had "rapid heart beat". Also had stress test, reportedly normal. Was hospitalized - couple days. Nothing on tele at that time.  Fhx: Father died MI 110. Sister +T copd Mom - multiple cancers Sister- brain aneurysm.   Past Medical History:  Diagnosis Date  . Diabetes mellitus without complication (HCC)    Prediabetic  . Hypertension     Past Surgical History:  Procedure Laterality Date  . ABDOMINAL HYSTERECTOMY      Current Medications: Current Meds  Medication Sig  . amLODipine (NORVASC) 10 MG tablet Take 10 mg by mouth daily.   . diclofenac Sodium (VOLTAREN) 1 % GEL Apply 2 g topically 4 (four) times daily.  . hydrochlorothiazide (HYDRODIURIL) 12.5 MG tablet Take 12.5 mg by mouth daily.  Marland Kitchen JARDIANCE 10 MG TABS tablet Take 10 mg by mouth daily.     Allergies:   Morphine and Morphine and related   Social History   Tobacco Use  . Smoking status: Never Smoker  . Smokeless tobacco: Never Used  Substance Use Topics  . Alcohol use: Never  . Drug use: Never     Family History: The patient's family history is not on file.  ROS:   Please see the history of present illness.    All other systems reviewed and are negative.  EKGs/Labs/Other Studies Reviewed:    The following studies were reviewed today:  EKG:  NSR, 72 bpm.  Recent Labs: 09/24/2020: ALT 14; BUN 18; Creatinine, Ser 1.16; Hemoglobin 12.8; Platelets 250; Potassium 3.8; Sodium 139  Recent Lipid Panel No results found for: CHOL, TRIG, HDL, CHOLHDL, VLDL, LDLCALC, LDLDIRECT  Physical Exam:    VS:  BP (!) 142/100 (BP Location: Left Arm, Patient Position: Sitting, Cuff Size: Normal)   Pulse 72   Ht 5\' 2"  (1.575 m)   Wt 202 lb 9.6 oz (91.9 kg)   SpO2 96%   BMI 37.06 kg/m     Wt Readings from Last 5 Encounters:  09/28/20 202  lb 9.6 oz (91.9 kg)  09/24/20 199 lb (90.3 kg)  07/18/20 238 lb 1.6 oz (108 kg)  11/20/19 238 lb 1.6 oz (108 kg)  10/14/19 215 lb (97.5 kg)    Constitutional: No acute distress Eyes: sclera non-icteric, normal conjunctiva and lids ENMT: normal dentition, moist mucous membranes Cardiovascular: regular rhythm, normal rate, no murmurs. S1 and S2 normal. Radial pulses normal bilaterally. No jugular venous distention.  Respiratory: clear to auscultation bilaterally GI : normal bowel sounds, soft and nontender. No distention.   MSK: extremities warm, well perfused. No edema.  NEURO: grossly nonfocal exam, moves all extremities. PSYCH: alert and oriented x 3, normal mood and affect.   ASSESSMENT:    1. Syncope and  collapse   2. Primary hypertension   3. Type 2 diabetes mellitus without complication, without long-term current use of insulin (HCC)    PLAN:    Syncope and collapse - Plan: EKG 12-Lead, ECHOCARDIOGRAM COMPLETE, Cardiac event monitor -She has concerning sounding syncope and requires a cardiovascular evaluation.  Symptoms sound most consistent with neurocardiogenic syncope given prodrome.  May be related to underlying diabetes but blood sugar has been grossly normal per her report on EMS review.  We will perform an echocardiogram given her longstanding history of syncope when she was in her 5920s as well, and will perform cardiac event monitor for 30 days.  If no events are found but syncope continues, would consider implantable loop recorder.  No definite exertional chest pain, however if she continues have symptoms of chest discomfort related to syncope, an ischemic evaluation is also warranted.  Primary hypertension  -Mildly elevated blood pressure today, she takes clonidine, losartan and amlodipine.  Given episodes of syncope without clear etiology yet, I would not aggressively lower her blood pressure until we have a better understanding of the etiology of her syncope.  We will review closely at follow-up.  Some of her symptoms may be related to this combination of antihypertensive regimen, will rule out structural heart disease first and discuss at next follow-up.  Type 2 diabetes mellitus without complication, without long-term current use of insulin (HCC) -She has had some difficulty tolerating medications for diabetes, it appears she is currently on Jardiance  Total time of encounter: 60 minutes total time of encounter, including 30 minutes spent in face-to-face patient care on the date of this encounter. This time includes coordination of care and counseling regarding above mentioned problem list. Remainder of non-face-to-face time involved reviewing chart documents/testing relevant to the  patient encounter and documentation in the medical record. I have independently reviewed documentation from referring provider.   Weston BrassGayatri Haniah Penny, MD, [facility] [place]  [facility]    Medication Adjustments/Labs and Tests Ordered: Current medicines are reviewed at length with the patient today.  Concerns regarding medicines are outlined above.   Orders Placed This Encounter  Procedures  . Cardiac event monitor  . EKG 12-Lead  . ECHOCARDIOGRAM COMPLETE    No orders of the defined types were placed in this encounter.   Patient Instructions  Medication Instructions:  No Changes In Medications at this time.  *If you need a refill on your cardiac medications before your next appointment, please call your pharmacy*  Testing/Procedures: Your physician has recommended that you wear an event monitor. Event monitors are medical devices that record the heart's electrical activity. Doctors most often us these monitors to diagnose arrhythmias. Arrhythmias are problems with the speed or rhythm of the heartbeat. The monitor is a small, portable device.  You can wear one while you do your normal daily activities. This is usually used to diagnose what is causing palpitations/syncope (passing out).  Your physician has requested that you have an echocardiogram. Echocardiography is a painless test that uses sound waves to create images of your heart. It provides your doctor with information about the size and shape of your heart and how well your heart's chambers and valves are working. You may receive an ultrasound enhancing agent through an IV if needed to better visualize your heart during the echo.This procedure takes approximately one hour. There are no restrictions for this procedure. This will take place at the [address], [address].   Follow-Up: At [facility], you and your health needs are our priority.  As part of our continuing mission to provide you with exceptional heart care, we  have created designated Provider Care Teams.  These Care Teams include your primary Cardiologist (physician) and Advanced Practice Providers (APPs -  Physician Assistants and Nurse Practitioners) who all work together to provide you with the care you need, when you need it.  We recommend signing up for the patient portal called "MyChart".  Sign up information is provided on this After Visit Summary.  MyChart is used to connect with patients for Virtual Visits (Telemedicine).  Patients are able to view lab/test results, encounter notes, upcoming appointments, etc.  Non-urgent messages can be sent to your provider as well.   To learn more about what you can do with MyChart, go to [email or web address].    Your next appointment:   6-8 Weeks   The format for your next appointment:   In Person  Provider:   Weston Brass, MD  Other Instructions  [employer] Cardiac Event Monitor Instructions Your physician has requested you wear your cardiac event monitor for 30 days, (1-30). [employer] may call or text to confirm a shipping address. The monitor will be sent to a land address via UPS. [employer] will not ship a monitor to a PO BOX. It typically takes 3-5 days to receive your monitor after it has been enrolled. [employer] will assist with USPS tracking if your package is delayed. The telephone number for [employer] is [phone]. Once you have received your monitor, please review the enclosed instructions. Instruction tutorials can also be viewed under help and settings on the enclosed cell phone. Your monitor has already been registered assigning a specific monitor serial # to you.  Applying the monitor Remove cell phone from case and turn it on. The cell phone works as IT consultant and needs to be within [employer] of you at all times. The cell phone will need to be charged on a daily basis. We recommend you plug the cell phone into the enclosed charger at your bedside table every  night.  Monitor batteries: You will receive two monitor batteries labelled #1 and #2. These are your recorders. Plug battery #2 onto the second connection on the enclosed charger. Keep one battery on the charger at all times. This will keep the monitor battery deactivated. It will also keep it fully charged for when you need to switch your monitor batteries. A small light will be blinking on the battery emblem when it is charging. The light on the battery emblem will remain on when the battery is fully charged.  Open package of a Monitor strip. Insert battery #1 into black hood on strip and gently squeeze monitor battery onto connection as indicated in instruction booklet. Set aside while preparing skin.  Choose location for your strip, vertical or horizontal, as indicated in the instruction booklet. Shave to remove all hair from location. There cannot be any lotions, oils, powders, or colognes on skin where monitor is to be applied. Wipe skin clean with enclosed Saline wipe. Dry skin completely.  Peel paper labeled #1 off the back of the Monitor strip exposing the adhesive. Place the monitor on the chest in the vertical or horizontal position shown in the instruction booklet. One arrow on the monitor strip must be pointing upward. Carefully remove paper labeled #2, attaching remainder of strip to your skin. Try not to create any folds or wrinkles in the strip as you apply it.  Firmly press and release the circle in the center of the monitor battery. You will hear a small beep. This is turning the monitor battery on. The heart emblem on the monitor battery will light up every 5 seconds if the monitor battery in turned on and connected to the patient securely. Do not push and hold the circle down as this turns the monitor battery off. The cell phone will locate the monitor battery. A screen will appear on the cell phone checking the connection of your monitor strip. This may read poor  connection initially but change to good connection within the next minute. Once your monitor accepts the connection you will hear a series of 3 beeps followed by a climbing crescendo of beeps. A screen will appear on the cell phone showing the two monitor strip placement options. Touch the picture that demonstrates where you applied the monitor strip.  Your monitor strip and battery are waterproof. You are able to shower, bathe, or swim with the monitor on. They just ask you do not submerge deeper than 3 feet underwater. We recommend removing the monitor if you are swimming in a lake, river, or ocean.  Your monitor battery will need to be switched to a fully charged monitor battery approximately once a week. The cell phone will alert you of an action which needs to be made.  On the cell phone, tap for details to reveal connection status, monitor battery status, and cell phone battery status. The green dots indicates your monitor is in good status. A red dot indicates there is something that needs your attention.  To record a symptom, click the circle on the monitor battery. In 30-60 seconds a list of symptoms will appear on the cell phone. Select your symptom and tap save. Your monitor will record a sustained or significant arrhythmia regardless of you clicking the button. Some patients do not feel the heart rhythm irregularities. [employer] will notify us of any serious or critical events.  Refer to instruction booklet for instructions on switching batteries, changing strips, the Do not disturb or Pause features, or any additional questions.  Call [employer] at [phone], to confirm your monitor is transmitting and record your baseline. They will answer any questions you may have regarding the monitor instructions at that time.  Returning the monitor to [employer] Place all equipment back into blue box. Peel off strip of paper to expose adhesive and close box securely. There is a  prepaid UPS shipping label on this box. Drop in a UPS drop box, or at a UPS facility like [employer]. You may also contact [employer] to arrange UPS to pick up monitor package at your home.

## 2020-09-28 NOTE — Patient Instructions (Signed)
Medication Instructions:  No Changes In Medications at this time.  *If you need a refill on your cardiac medications before your next appointment, please call your pharmacy*  Testing/Procedures: Your physician has recommended that you wear an event monitor. Event monitors are medical devices that record the heart's electrical activity. Doctors most often [place] these monitors to diagnose arrhythmias. Arrhythmias are problems with the speed or rhythm of the heartbeat. The monitor is a small, portable device. You can wear one while you do your normal daily activities. This is usually used to diagnose what is causing palpitations/syncope (passing out).  Your physician has requested that you have an echocardiogram. Echocardiography is a painless test that uses sound waves to create images of your heart. It provides your doctor with information about the size and shape of your heart and how well your heart's chambers and valves are working. You may receive an ultrasound enhancing agent through an IV if needed to better visualize your heart during the echo.This procedure takes approximately one hour. There are no restrictions for this procedure. This will take place at the [address].   Follow-Up: At [facility], you and your health needs are our priority.  As part of our continuing mission to provide you with exceptional heart care, we have created designated Provider Care Teams.  These Care Teams include your primary Cardiologist (physician) and Advanced Practice Providers (APPs -  Physician Assistants and Nurse Practitioners) who all work together to provide you with the care you need, when you need it.  We recommend signing up for the patient portal called "MyChart".  Sign up information is provided on this After Visit Summary.  MyChart is used to connect with patients for Virtual Visits (Telemedicine).  Patients are able to view lab/test results, encounter notes, upcoming appointments,  etc.  Non-urgent messages can be sent to your provider as well.   To learn more about what you can do with MyChart, go to [email or web address].    Your next appointment:   6-8 Weeks   The format for your next appointment:   In Person  Provider:   Weston Brass, MD  Other Instructions  [employer] Cardiac Event Monitor Instructions Your physician has requested you wear your cardiac event monitor for 30 days, (1-30). [employer] may call or text to confirm a shipping address. The monitor will be sent to a land address via UPS. [employer] will not ship a monitor to a PO BOX. It typically takes 3-5 days to receive your monitor after it has been enrolled. [employer] will assist with USPS tracking if your package is delayed. The telephone number for [employer] is [phone]. Once you have received your monitor, please review the enclosed instructions. Instruction tutorials can also be viewed under help and settings on the enclosed cell phone. Your monitor has already been registered assigning a specific monitor serial # to you.  Applying the monitor Remove cell phone from case and turn it on. The cell phone works as IT consultant and needs to be within [employer] of you at all times. The cell phone will need to be charged on a daily basis. We recommend you plug the cell phone into the enclosed charger at your bedside table every night.  Monitor batteries: You will receive two monitor batteries labelled #1 and #2. These are your recorders. Plug battery #2 onto the second connection on the enclosed charger. Keep one battery on the charger at all times. This will keep the monitor battery deactivated.  It will also keep it fully charged for when you need to switch your monitor batteries. A small light will be blinking on the battery emblem when it is charging. The light on the battery emblem will remain on when the battery is fully charged.  Open package of a Monitor strip. Insert  battery #1 into black hood on strip and gently squeeze monitor battery onto connection as indicated in instruction booklet. Set aside while preparing skin.  Choose location for your strip, vertical or horizontal, as indicated in the instruction booklet. Shave to remove all hair from location. There cannot be any lotions, oils, powders, or colognes on skin where monitor is to be applied. Wipe skin clean with enclosed Saline wipe. Dry skin completely.  Peel paper labeled #1 off the back of the Monitor strip exposing the adhesive. Place the monitor on the chest in the vertical or horizontal position shown in the instruction booklet. One arrow on the monitor strip must be pointing upward. Carefully remove paper labeled #2, attaching remainder of strip to your skin. Try not to create any folds or wrinkles in the strip as you apply it.  Firmly press and release the circle in the center of the monitor battery. You will hear a small beep. This is turning the monitor battery on. The heart emblem on the monitor battery will light up every 5 seconds if the monitor battery in turned on and connected to the patient securely. Do not push and hold the circle down as this turns the monitor battery off. The cell phone will locate the monitor battery. A screen will appear on the cell phone checking the connection of your monitor strip. This may read poor connection initially but change to good connection within the next minute. Once your monitor accepts the connection you will hear a series of 3 beeps followed by a climbing crescendo of beeps. A screen will appear on the cell phone showing the two monitor strip placement options. Touch the picture that demonstrates where you applied the monitor strip.  Your monitor strip and battery are waterproof. You are able to shower, bathe, or swim with the monitor on. They just ask you do not submerge deeper than 3 feet underwater. We recommend removing the monitor if you  are swimming in a lake, river, or ocean.  Your monitor battery will need to be switched to a fully charged monitor battery approximately once a week. The cell phone will alert you of an action which needs to be made.  On the cell phone, tap for details to reveal connection status, monitor battery status, and cell phone battery status. The green dots indicates your monitor is in good status. A red dot indicates there is something that needs your attention.  To record a symptom, click the circle on the monitor battery. In 30-60 seconds a list of symptoms will appear on the cell phone. Select your symptom and tap save. Your monitor will record a sustained or significant arrhythmia regardless of you clicking the button. Some patients do not feel the heart rhythm irregularities. [employer] will notify us of any serious or critical events.  Refer to instruction booklet for instructions on switching batteries, changing strips, the Do not disturb or Pause features, or any additional questions.  Call [employer] at [phone], to confirm your monitor is transmitting and record your baseline. They will answer any questions you may have regarding the monitor instructions at that time.  Returning the monitor to [employer] Place all equipment back into  blue box. Peel off strip of paper to expose adhesive and close box securely. There is a prepaid UPS shipping label on this box. Drop in a UPS drop box, or at a UPS facility like [employer]. You may also contact [employer] to arrange UPS to pick up monitor package at your home.

## 2020-10-05 ENCOUNTER — Ambulatory Visit (INDEPENDENT_AMBULATORY_CARE_PROVIDER_SITE_OTHER): Payer: Medicare Other

## 2020-10-05 DIAGNOSIS — R55 Syncope and collapse: Secondary | ICD-10-CM | POA: Diagnosis not present

## 2020-10-09 ENCOUNTER — Telehealth: Payer: Self-pay | Admitting: Internal Medicine

## 2020-10-09 NOTE — Telephone Encounter (Signed)
Pt c/o Syncope: STAT if syncope occurred within 30 minutes and pt complains of lightheadedness High Priority if episode of passing out, completely, today or in last 24 hours   1. Did you pass out today? No   2. When is the last time you passed out? Last week  3. Has this occurred multiple times? Yes   4. Did you have any symptoms prior to passing out? Weak, sweaty and clammy, lightheadedness

## 2020-10-09 NOTE — Telephone Encounter (Signed)
Spoke with pt on the phone regarding passing out. Pt was seen on 09/28/20 by Dr. Jacques Navy with similar complaint. Pt is currently wearing [employer] monitor. Pt states that she has passed out 2 times since she was last seen in the office. Pt states that she sometimes has warning that the episode is starting but other times can come out of no where. Pt is scared to leave the house because she doesn't want to pass out while in public. Pt explains that the most recent episode started with her feeling sweaty and lightheaded then she became weak and experienced shortness of breath. Pt also states that when she has an episode she sometimes has pain in her legs, back and upper chest. Pt takes her blood pressure twice while on the phone with results of 159/105 and 190/123 respectively. Heart rates were 101bpm and 103bpm respectively. Pt states that she is currently taking her medications as prescribed. Pt has echo scheduled for 11/01/20.

## 2020-10-10 NOTE — Telephone Encounter (Signed)
Agree with ED presentation for hypertensive urgency.

## 2020-10-10 NOTE — Telephone Encounter (Signed)
Pt returning call from yesterday stating that she did not hear back from anyone yesterday. Please advise

## 2020-10-10 NOTE — Telephone Encounter (Signed)
Spoke to pt who is upset she didn't receive a call back from previous message. Pt state she is still symptomatic with c/o BP 192/123, headache, and feeling dizzy.    Based on symptoms, nurse advised pt to report to ER for further evaluations.

## 2020-10-16 ENCOUNTER — Telehealth (HOSPITAL_COMMUNITY): Payer: Self-pay | Admitting: Internal Medicine

## 2020-10-16 NOTE — Telephone Encounter (Signed)
Patient cancelled echocardiogram for reason below:  10/15/2020 10:01 AM [id number], Connie Mcintosh  Cancel Rsn: Patient (pt getting Mcintosh new provider)  Order will be removed from the ECHO WQ and if patient calls back in the future to reschedule we will reinstate the order. Thank you

## 2020-10-16 NOTE — Telephone Encounter (Signed)
Sent to primary nurse as FYI 

## 2020-10-26 NOTE — Telephone Encounter (Signed)
Noted  

## 2020-11-01 ENCOUNTER — Other Ambulatory Visit (HOSPITAL_COMMUNITY): Payer: Medicare Other

## 2020-11-16 ENCOUNTER — Ambulatory Visit: Payer: Medicare Other | Admitting: Internal Medicine

## 2021-01-23 ENCOUNTER — Emergency Department (HOSPITAL_BASED_OUTPATIENT_CLINIC_OR_DEPARTMENT_OTHER): Payer: Medicare Other

## 2021-01-23 ENCOUNTER — Emergency Department (HOSPITAL_BASED_OUTPATIENT_CLINIC_OR_DEPARTMENT_OTHER)
Admission: EM | Admit: 2021-01-23 | Discharge: 2021-01-23 | Disposition: A | Discharge status: Home / Self Care | Payer: Medicare Other | Attending: Emergency Medicine | Admitting: Emergency Medicine

## 2021-01-23 ENCOUNTER — Other Ambulatory Visit: Payer: Self-pay

## 2021-01-23 ENCOUNTER — Encounter (HOSPITAL_BASED_OUTPATIENT_CLINIC_OR_DEPARTMENT_OTHER): Payer: Self-pay | Admitting: Obstetrics and Gynecology

## 2021-01-23 DIAGNOSIS — E119 Type 2 diabetes mellitus without complications: Secondary | ICD-10-CM | POA: Insufficient documentation

## 2021-01-23 DIAGNOSIS — Z7984 Long term (current) use of oral hypoglycemic drugs: Secondary | ICD-10-CM | POA: Insufficient documentation

## 2021-01-23 DIAGNOSIS — Z79899 Other long term (current) drug therapy: Secondary | ICD-10-CM | POA: Diagnosis not present

## 2021-01-23 DIAGNOSIS — M549 Dorsalgia, unspecified: Secondary | ICD-10-CM | POA: Diagnosis not present

## 2021-01-23 DIAGNOSIS — H53149 Visual discomfort, unspecified: Secondary | ICD-10-CM | POA: Diagnosis not present

## 2021-01-23 DIAGNOSIS — M542 Cervicalgia: Secondary | ICD-10-CM | POA: Insufficient documentation

## 2021-01-23 DIAGNOSIS — I10 Essential (primary) hypertension: Secondary | ICD-10-CM | POA: Diagnosis not present

## 2021-01-23 DIAGNOSIS — G43801 Other migraine, not intractable, with status migrainosus: Secondary | ICD-10-CM

## 2021-01-23 DIAGNOSIS — R030 Elevated blood-pressure reading, without diagnosis of hypertension: Secondary | ICD-10-CM | POA: Diagnosis present

## 2021-01-23 LAB — CBC
HCT: 41.6 % (ref 36.0–46.0)
Hemoglobin: 12.8 g/dL (ref 12.0–15.0)
MCH: 21.3 pg — ABNORMAL LOW (ref 26.0–34.0)
MCHC: 30.8 g/dL (ref 30.0–36.0)
MCV: 69.1 fL — ABNORMAL LOW (ref 80.0–100.0)
Platelets: 239 10*3/uL (ref 150–400)
RBC: 6.02 MIL/uL — ABNORMAL HIGH (ref 3.87–5.11)
RDW: 17.5 % — ABNORMAL HIGH (ref 11.5–15.5)
WBC: 7.6 10*3/uL (ref 4.0–10.5)
nRBC: 0 % (ref 0.0–0.2)

## 2021-01-23 LAB — URINALYSIS, ROUTINE W REFLEX MICROSCOPIC
Bilirubin Urine: NEGATIVE
Glucose, UA: NEGATIVE mg/dL
Hgb urine dipstick: NEGATIVE
Ketones, ur: NEGATIVE mg/dL
Leukocytes,Ua: NEGATIVE
Nitrite: NEGATIVE
Protein, ur: NEGATIVE mg/dL
Specific Gravity, Urine: 1.012 (ref 1.005–1.030)
pH: 5.5 (ref 5.0–8.0)

## 2021-01-23 LAB — BASIC METABOLIC PANEL
Anion gap: 11 (ref 5–15)
BUN: 19 mg/dL (ref 6–20)
CO2: 25 mmol/L (ref 22–32)
Calcium: 9.3 mg/dL (ref 8.9–10.3)
Chloride: 104 mmol/L (ref 98–111)
Creatinine, Ser: 1.03 mg/dL — ABNORMAL HIGH (ref 0.44–1.00)
GFR, Estimated: >60 mL/min (ref >60)
Glucose, Bld: 137 mg/dL — ABNORMAL HIGH (ref 70–99)
Potassium: 3.6 mmol/L (ref 3.5–5.1)
Sodium: 140 mmol/L (ref 135–145)

## 2021-01-23 MED ORDER — METOCLOPRAMIDE HCL 5 MG/ML IJ SOLN
10.0000 mg | Freq: Once | INTRAMUSCULAR | Status: AC
  Administered 2021-01-23: 10 mg via INTRAVENOUS
  Filled 2021-01-23: qty 2

## 2021-01-23 MED ORDER — BUTALBITAL-APAP-CAFFEINE 50-325-40 MG PO TABS: 1.0000 | ORAL_TABLET | Freq: Four times a day (QID) | ORAL | 0 refills | Status: AC | PRN

## 2021-01-23 MED ORDER — LACTATED RINGERS IV BOLUS
1000.0000 mL | Freq: Once | INTRAVENOUS | Status: AC
  Administered 2021-01-23: 1000 mL via INTRAVENOUS

## 2021-01-23 NOTE — ED Triage Notes (Signed)
Patient reports to the ER for headache x4 days that is making it hard for patient to sleep. Patient reports she has HTN and DM and has not had her medication in weeks. Patient reports she does not check her sugars and that the headache has gotten progressively worse

## 2021-01-23 NOTE — ED Notes (Signed)
  Patient called out stating that she was not being helped and wanted to leave.  Patient had been requesting water and was told she could not have anything until the EDP saw her.  Patient requested IVF be stopped and the EDP come to talk with her.

## 2021-01-23 NOTE — ED Provider Notes (Signed)
[facility] EMERGENCY DEPT Provider Note   CSN: [id number] Arrival date & time: 01/23/21  1532     History Chief Complaint  Patient presents with   Headache    Connie Mcintosh is a 60 y.o. female.  The history is provided by the patient.  Headache Pain location:  R parietal Quality:  Dull (pulsating) Radiates to:  R neck Severity currently:  8/10 Severity at highest:  8/10 Onset quality:  Gradual Duration:  4 days Timing:  Constant Progression:  Worsening Chronicity:  Recurrent (Used to have migraines all the time but it has been a while) Context comment:  Started insidiously Relieved by:  Nothing Worsened by:  Activity, light and sound Ineffective treatments:  Aspirin Associated symptoms: back pain, dizziness, nausea, neck pain and photophobia   Associated symptoms: no abdominal pain, no blurred vision, no diarrhea, no fever, no focal weakness, no hearing loss, no loss of balance, no paresthesias, no tingling, no URI, no visual change, no vomiting and no weakness   Risk factors comment:  History of diabetes and hypertension.  Had Been out of medications for weeks but just started back on blood pressure medicine yesterday.     Past Medical History:  Diagnosis Date   Diabetes mellitus without complication (HCC)    Prediabetic   Hypertension     There are no problems to display for this patient.   Past Surgical History:  Procedure Laterality Date   ABDOMINAL HYSTERECTOMY       OB History   No obstetric history on file.     No family history on file.  Social History   Tobacco Use   Smoking status: Never   Smokeless tobacco: Never  Vaping Use   Vaping Use: Never used  Substance Use Topics   Alcohol use: Never   Drug use: Never    Home Medications Prior to Admission medications   Medication Sig Start Date End Date Taking? Authorizing Provider  amLODipine (NORVASC) 10 MG tablet Take 10 mg by mouth daily.    [provider]   diclofenac Sodium (VOLTAREN) 1 % GEL Apply 2 g topically 4 (four) times daily. 11/20/19   Mare Ferrari, PA-C  hydrochlorothiazide (HYDRODIURIL) 12.5 MG tablet Take 12.5 mg by mouth daily.    [provider]  JARDIANCE 10 MG TABS tablet Take 10 mg by mouth daily.    [provider]    Allergies    Morphine and Morphine and related  Review of Systems   Review of Systems  Constitutional:  Negative for fever.  HENT:  Negative for hearing loss.   Eyes:  Positive for photophobia. Negative for blurred vision.  Gastrointestinal:  Positive for nausea. Negative for abdominal pain, diarrhea and vomiting.  Musculoskeletal:  Positive for back pain and neck pain.  Neurological:  Positive for dizziness and headaches. Negative for focal weakness, weakness, paresthesias and loss of balance.  All other systems reviewed and are negative.  Physical Exam Updated Vital Signs BP (!) 170/104 (BP Location: Right Arm)   Pulse 82   Temp 98.3 F (36.8 C) (Oral)   Resp 18   Ht 5\' 2"  (1.575 m)   Wt 88.5 kg   SpO2 96%   BMI 35.67 kg/m   Physical Exam Vitals and nursing note reviewed.  Constitutional:      General: She is not in acute distress.    Appearance: She is well-developed.  HENT:     Head: Normocephalic and atraumatic.     Mouth/Throat:  Mouth: Mucous membranes are moist.  Eyes:     Pupils: Pupils are equal, round, and reactive to light.     Comments: Mild photophobia  Cardiovascular:     Rate and Rhythm: Normal rate and regular rhythm.     Pulses: Normal pulses.     Heart sounds: Normal heart sounds. No murmur heard.   No friction rub.  Pulmonary:     Effort: Pulmonary effort is normal.     Breath sounds: Normal breath sounds. No wheezing or rales.  Abdominal:     General: Bowel sounds are normal. There is no distension.     Palpations: Abdomen is soft.     Tenderness: There is no abdominal tenderness. There is no guarding or rebound.  Musculoskeletal:         General: No tenderness. Normal range of motion.     Cervical back: Normal range of motion and neck supple. No rigidity or tenderness.     Comments: No edema  Skin:    General: Skin is warm and dry.     Findings: No rash.  Neurological:     Mental Status: She is alert and oriented to person, place, and time. Mental status is at baseline.     Cranial Nerves: No cranial nerve deficit.     Sensory: No sensory deficit.     Motor: No weakness.     Coordination: Coordination normal.     Gait: Gait normal.  Psychiatric:        Mood and Affect: Mood normal.        Behavior: Behavior normal.    ED Results / Procedures / Treatments   Labs (all labs ordered are listed, but only abnormal results are displayed) Labs Reviewed  BASIC METABOLIC PANEL - Abnormal; Notable for the following components:      Result Value   Glucose, Bld 137 (*)    Creatinine, Ser 1.03 (*)    All other components within normal limits  CBC - Abnormal; Notable for the following components:   RBC 6.02 (*)    MCV 69.1 (*)    MCH 21.3 (*)    RDW 17.5 (*)    All other components within normal limits  URINALYSIS, ROUTINE W REFLEX MICROSCOPIC - Abnormal; Notable for the following components:   APPearance HAZY (*)    Bacteria, UA RARE (*)    All other components within normal limits    EKG None  Radiology CT Head Wo Contrast  Result Date: 01/23/2021 CLINICAL DATA:  Headache, classic migraine Headache for 4 days. EXAM: CT HEAD WITHOUT CONTRAST TECHNIQUE: Contiguous axial images were obtained from the base of the skull through the vertex without intravenous contrast. COMPARISON:  None. FINDINGS: Brain: No intracranial hemorrhage, mass effect, or midline shift. No hydrocephalus. The basilar cisterns are patent. Tiny remote lacunar infarcts in the left basal ganglia and caudate. No evidence of territorial infarct or acute ischemia. No extra-axial or intracranial fluid collection. Vascular: No hyperdense vessel or  unexpected calcification. Skull: No fracture or focal lesion. Sinuses/Orbits: Paranasal sinuses and mastoid air cells are clear. The visualized orbits are unremarkable. Other: Subcutaneous lesion at the parietal vertex measures 2.8 x 2.4 cm and is likely a sebaceous cyst, however nonspecific. IMPRESSION: 1. No acute intracranial abnormality. 2. Subcutaneous lesion at the parietal vertex measures 2.8 x 2.4 cm and is likely a sebaceous cyst, however nonspecific. This should be amenable to physical exam. Electronically Signed   By: Narda Rutherford M.D.   On: 01/23/2021  20:33    Procedures Procedures   Medications Ordered in ED Medications  metoCLOPramide (REGLAN) injection 10 mg (has no administration in time range)  lactated ringers bolus 1,000 mL (has no administration in time range)    ED Course  I have reviewed the triage vital signs and the nursing notes.  Pertinent labs & imaging results that were available during my care of the patient were reviewed by me and considered in my medical decision making (see chart for details).    MDM Rules/Calculators/A&P                           Patient is a 59 year old female presenting today with headache that has been gradually worsening.  No thunderclap headache or worst of her life concerning for subarachnoid hemorrhage.  Patient has no neurologic findings concerning for stroke or intercranial bleed.  She is hypertensive today but had been out of blood pressure medication for weeks and just started back yesterday.  She is on losartan and hydrochlorothiazide which she took a dose yesterday and today.  She has had no infectious symptoms to suggest recent COVID, pneumonia.  Low suspicion for PE.  Renal function with a creatinine of 1 today, blood sugar 136 despite not being on Jardiance for a month and this is nonfasting.  CBC with normal hemoglobin and white count today.  UA without acute findings.  Suspect migraine over the last 4 days with a prior  history of similar.  No neurologic findings or facial swelling.  Low suspicion for SVC syndrome, venous thrombosis.  Will give headache cocktail and follow if she has pain improvement and recheck blood pressure.  11:44 PM On repeat evaluation patient reports that she was concerned that she had something wrong with her brain because her sister had a brain aneurysm in the past.  Head CT is negative for acute findings and low suspicion for aneurysm at this time.  Patient did have a dystonic reaction to Reglan but was feeling improved after some time.  She continues to have elevated blood pressure but again continue to encourage her to take her blood pressure medication over the next week as it may take time since she just has taken her second dose in weeks.  Patient was given prescription for headache and return precautions.  Encouraged her to follow-up with her doctor if the headache continued.  MDM   Amount and/or Complexity of Data Reviewed Clinical lab tests: ordered and reviewed Tests in the radiology section of CPT: ordered and reviewed Independent visualization of images, tracings, or specimens: yes    Final Clinical Impression(s) / ED Diagnoses Final diagnoses:  Primary hypertension  Other migraine with status migrainosus, not intractable    Rx / DC Orders ED Discharge Orders          Ordered    butalbital-acetaminophen-caffeine (FIORICET) 50-325-40 MG tablet  Every 6 hours PRN        01/23/21 2115             Gwyneth Sprout, MD 01/23/21 2346

## 2021-01-23 NOTE — Discharge Instructions (Addendum)
Your blood work today looked good.  Your head CT did not show any sign of aneurysm, bleeds or tumors.  As you get back on your medications over the next week your blood pressure should start normalizing I would expect your headaches will get better with that.  If you start having any visual changes, trouble moving 1 side of your body or speech problems return to the emergency room immediately.

## 2021-01-23 NOTE — ED Notes (Signed)
Patient is resting comfortably. Pt states she feels a lot better, warm blanket given.

## 2021-02-06 ENCOUNTER — Telehealth: Payer: Self-pay | Admitting: Internal Medicine

## 2021-02-06 NOTE — Telephone Encounter (Signed)
    Pt is returning call to get heart monitor result 

## 2021-02-06 NOTE — Telephone Encounter (Signed)
Parke Poisson, MD  02/02/2021  2:54 PM EDT      IMPRESSION: Normal cardiac monitor, no source of syncope identified   The patient has been notified of the result and verbalized understanding.  All questions (if any) were answered. Theresia Majors, RN 02/06/2021 4:19 PM

## 2021-11-04 IMAGING — CT CT HEAD W/O CM
4 series · 16 of 47 positions shown, 18 images · non-contrast
Comparison: None.

CLINICAL DATA: Headache, classic migraine

Headache for 4 days.
EXAM:
CT HEAD WITHOUT CONTRAST
TECHNIQUE: Contiguous axial images were obtained from the base of the skull
through the vertex without intravenous contrast.

[Series 2: head wo · axial · 0.43mm/px · z∈[-266,-151]mm · 7 of 31 slices shown, 9 images]
[im 4/31  brain]
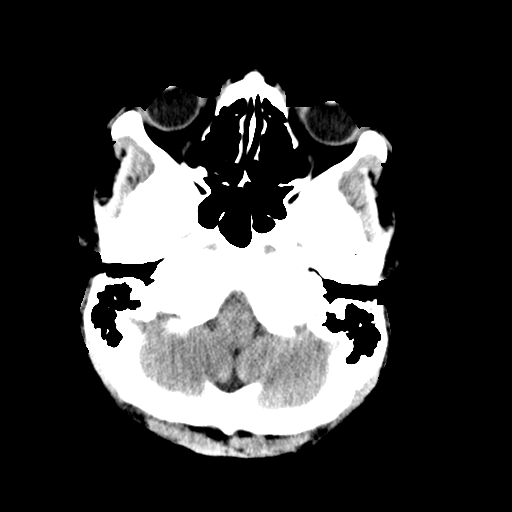
[im 4/31  bone]
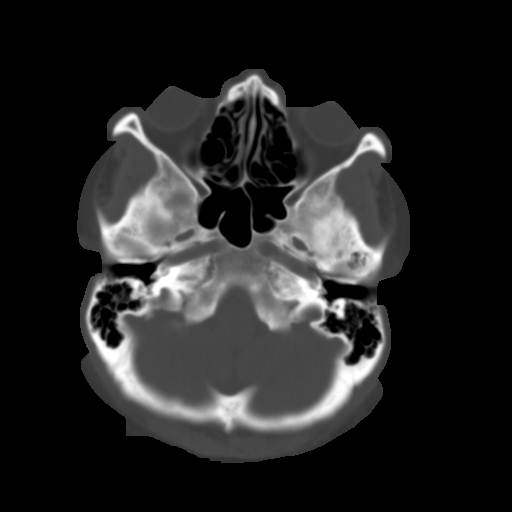
[im 8/31  brain]
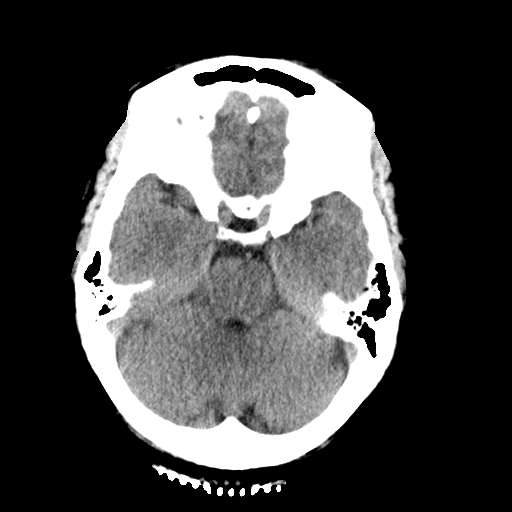
[im 12/31  brain]
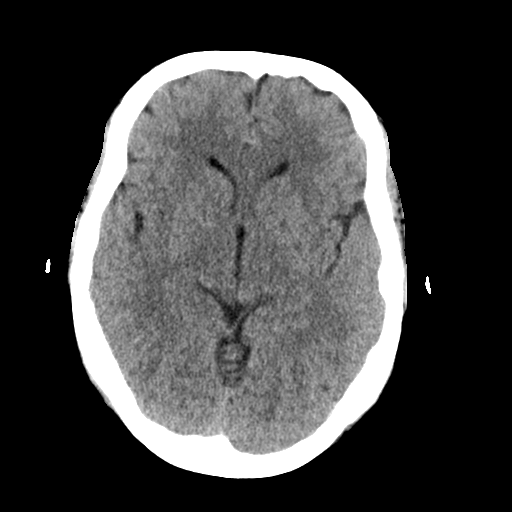
[im 16/31  brain]
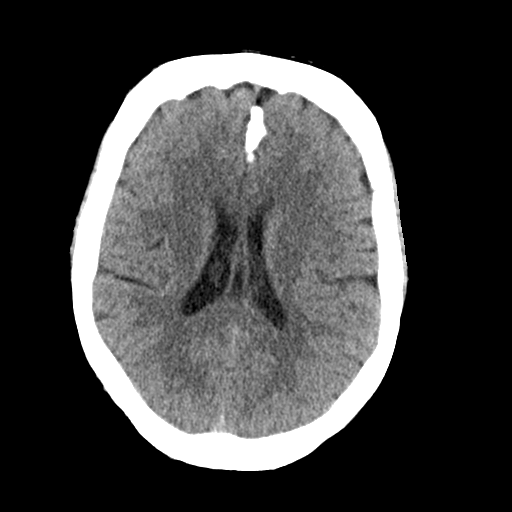
[im 19/31  brain]
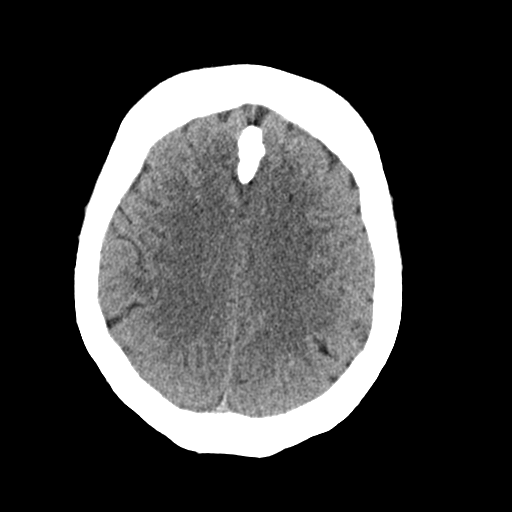
[im 19/31  bone]
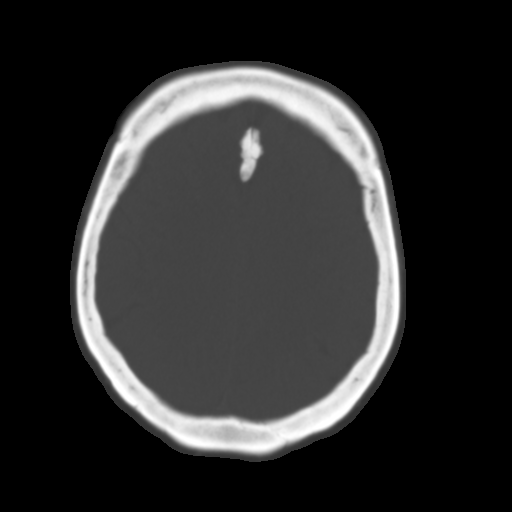
[im 23/31  brain]
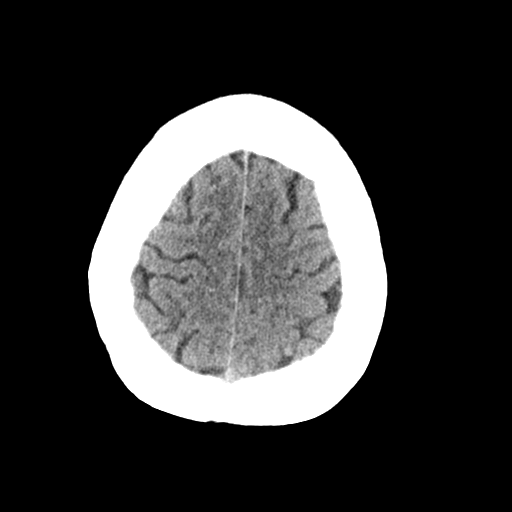
[im 27/31  brain]
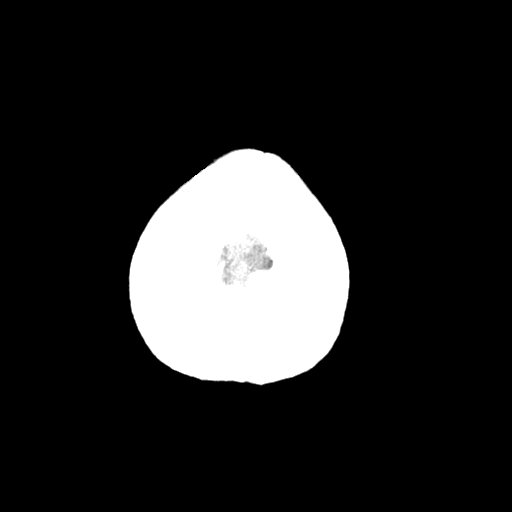

[Series 3: head bone · axial · 0.43mm/px · z∈[-267,-237]mm · 3 of 76 slices shown]
[im 8/76  bone]
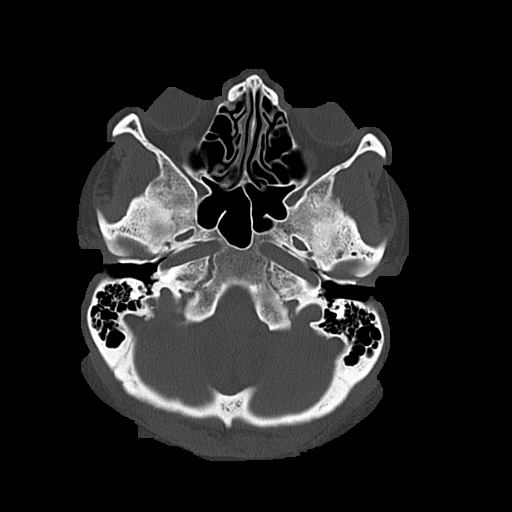
[im 16/76  bone]
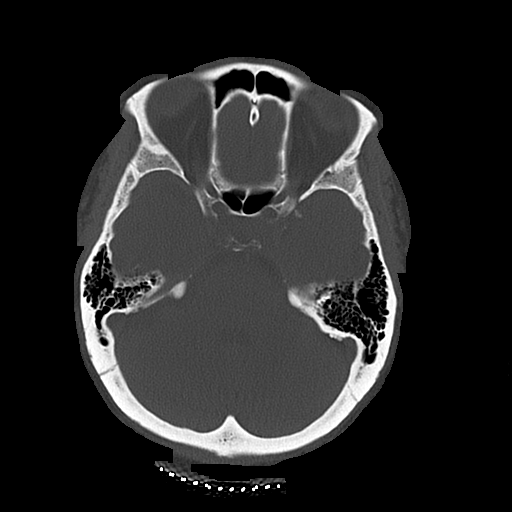
[im 23/76  bone]
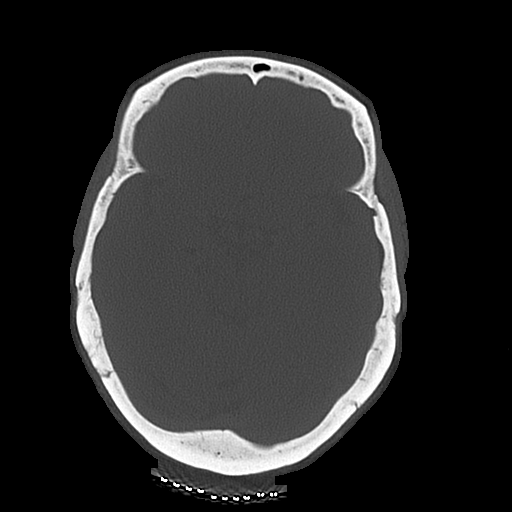

[Series 4: coronal soft · coronal · 0.33mm/px · 3 of 65 slices shown]
[im 22/65  brain]
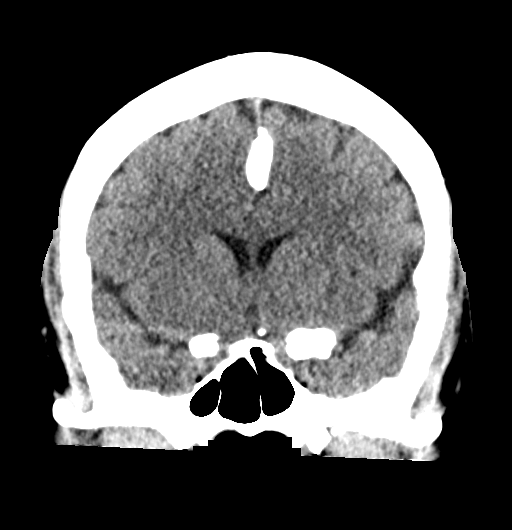
[im 29/65  brain]
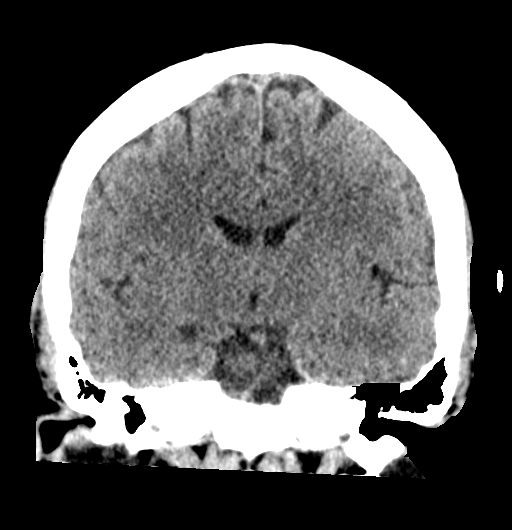
[im 36/65  brain]
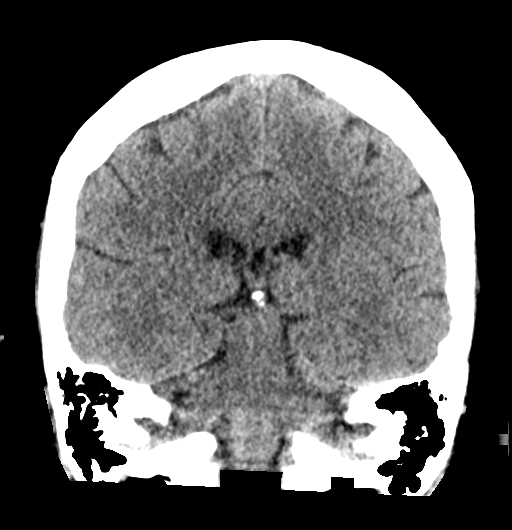

[Series 5: sagittal soft · sagittal · 0.34mm/px · 3 of 57 slices shown]
[im 19/57  brain]
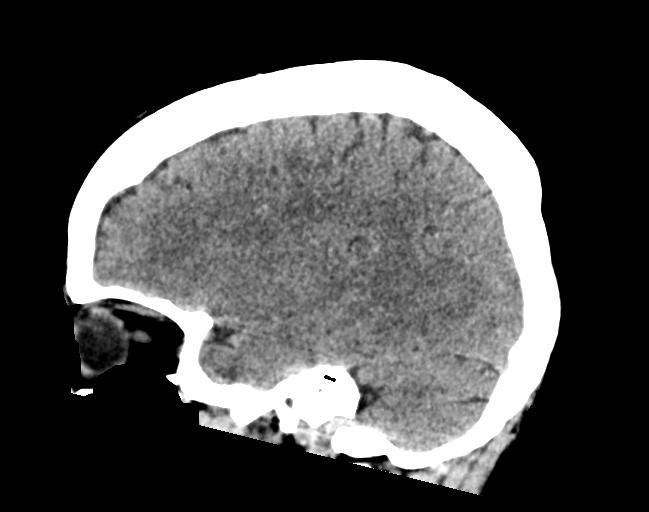
[im 29/57  brain]
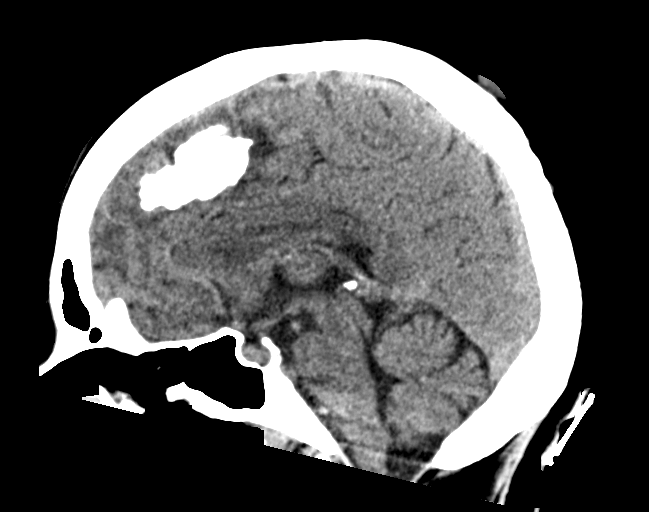
[im 38/57  brain]
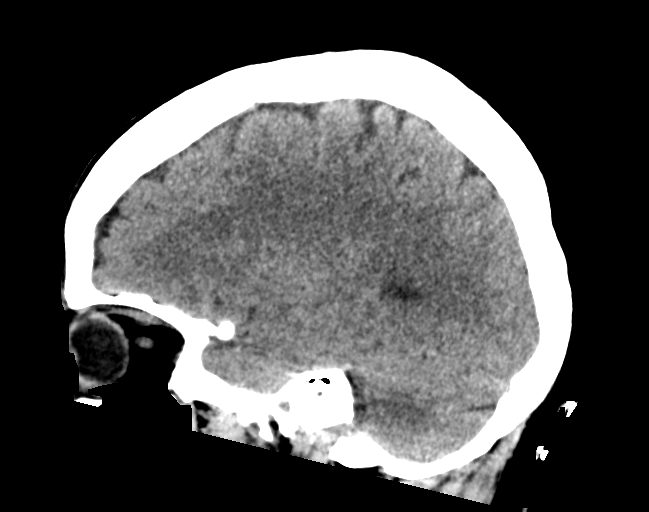

[16 of 47 positions shown; findings below may reference images not displayed]

FINDINGS: Brain: No intracranial hemorrhage, mass effect, or midline shift. No
hydrocephalus. The basilar cisterns are patent. Tiny remote lacunar
infarcts in the left basal ganglia and caudate. No evidence of
territorial infarct or acute ischemia. No extra-axial or
intracranial fluid collection.

Vascular: No hyperdense vessel or unexpected calcification.

Skull: No fracture or focal lesion.

Sinuses/Orbits: Paranasal sinuses and mastoid air cells are clear.
The visualized orbits are unremarkable.

Other: Subcutaneous lesion at the parietal vertex measures 2.8 x
cm and is likely a sebaceous cyst, however nonspecific.
IMPRESSION: 1. No acute intracranial abnormality.
2. Subcutaneous lesion at the parietal vertex measures 2.8 x 2.4 cm
and is likely a sebaceous cyst, however nonspecific. This should be
amenable to physical exam.
